# Patient Record
Sex: Female | Born: 1935
Health system: Southern US, Community
[De-identification: ages and names within clinical notes are randomized; demographics above are authoritative.]

## PROBLEM LIST (undated history)

## (undated) DIAGNOSIS — G43909 Migraine, unspecified, not intractable, without status migrainosus: Secondary | ICD-10-CM

## (undated) DIAGNOSIS — N390 Urinary tract infection, site not specified: Secondary | ICD-10-CM

## (undated) DIAGNOSIS — N952 Postmenopausal atrophic vaginitis: Secondary | ICD-10-CM

## (undated) DIAGNOSIS — E039 Hypothyroidism, unspecified: Secondary | ICD-10-CM

## (undated) DIAGNOSIS — J309 Allergic rhinitis, unspecified: Secondary | ICD-10-CM

## (undated) DIAGNOSIS — F329 Major depressive disorder, single episode, unspecified: Secondary | ICD-10-CM

## (undated) DIAGNOSIS — F32A Depression, unspecified: Secondary | ICD-10-CM

## (undated) DIAGNOSIS — K219 Gastro-esophageal reflux disease without esophagitis: Secondary | ICD-10-CM

## (undated) DIAGNOSIS — F419 Anxiety disorder, unspecified: Secondary | ICD-10-CM

## (undated) HISTORY — DX: Gastro-esophageal reflux disease without esophagitis: K21.9

## (undated) HISTORY — PX: ABDOMINAL HYSTERECTOMY: SHX81

## (undated) HISTORY — DX: Hypothyroidism, unspecified: E03.9

## (undated) HISTORY — DX: Depression, unspecified: F32.A

## (undated) HISTORY — DX: Allergic rhinitis, unspecified: J30.9

## (undated) HISTORY — DX: Major depressive disorder, single episode, unspecified: F32.9

## (undated) HISTORY — DX: Postmenopausal atrophic vaginitis: N95.2

## (undated) HISTORY — PX: BACK SURGERY: SHX140

## (undated) HISTORY — DX: Migraine, unspecified, not intractable, without status migrainosus: G43.909

## (undated) HISTORY — DX: Anxiety disorder, unspecified: F41.9

## (undated) HISTORY — DX: Urinary tract infection, site not specified: N39.0

---

## 2015-05-24 DIAGNOSIS — N83201 Unspecified ovarian cyst, right side: Secondary | ICD-10-CM | POA: Diagnosis not present

## 2015-06-08 DIAGNOSIS — K219 Gastro-esophageal reflux disease without esophagitis: Secondary | ICD-10-CM | POA: Diagnosis not present

## 2015-06-08 DIAGNOSIS — F320 Major depressive disorder, single episode, mild: Secondary | ICD-10-CM | POA: Diagnosis not present

## 2015-06-08 DIAGNOSIS — Z6823 Body mass index (BMI) 23.0-23.9, adult: Secondary | ICD-10-CM | POA: Diagnosis not present

## 2015-06-08 DIAGNOSIS — Z1389 Encounter for screening for other disorder: Secondary | ICD-10-CM | POA: Diagnosis not present

## 2015-06-08 DIAGNOSIS — I739 Peripheral vascular disease, unspecified: Secondary | ICD-10-CM | POA: Diagnosis not present

## 2015-06-08 DIAGNOSIS — N83201 Unspecified ovarian cyst, right side: Secondary | ICD-10-CM | POA: Diagnosis not present

## 2015-06-08 DIAGNOSIS — R634 Abnormal weight loss: Secondary | ICD-10-CM | POA: Diagnosis not present

## 2015-06-10 DIAGNOSIS — Z1211 Encounter for screening for malignant neoplasm of colon: Secondary | ICD-10-CM | POA: Diagnosis not present

## 2015-06-17 DIAGNOSIS — M19171 Post-traumatic osteoarthritis, right ankle and foot: Secondary | ICD-10-CM | POA: Diagnosis not present

## 2015-06-17 DIAGNOSIS — M24874 Other specific joint derangements of right foot, not elsewhere classified: Secondary | ICD-10-CM | POA: Diagnosis not present

## 2015-07-04 DIAGNOSIS — M79671 Pain in right foot: Secondary | ICD-10-CM | POA: Diagnosis not present

## 2015-08-05 DIAGNOSIS — R05 Cough: Secondary | ICD-10-CM | POA: Diagnosis not present

## 2015-08-05 DIAGNOSIS — H1045 Other chronic allergic conjunctivitis: Secondary | ICD-10-CM | POA: Diagnosis not present

## 2015-08-05 DIAGNOSIS — J310 Chronic rhinitis: Secondary | ICD-10-CM | POA: Diagnosis not present

## 2015-08-17 DIAGNOSIS — R338 Other retention of urine: Secondary | ICD-10-CM | POA: Diagnosis not present

## 2015-08-17 DIAGNOSIS — R300 Dysuria: Secondary | ICD-10-CM | POA: Diagnosis not present

## 2015-08-17 DIAGNOSIS — N390 Urinary tract infection, site not specified: Secondary | ICD-10-CM | POA: Diagnosis not present

## 2015-08-17 DIAGNOSIS — N3000 Acute cystitis without hematuria: Secondary | ICD-10-CM | POA: Diagnosis not present

## 2015-08-24 DIAGNOSIS — N3000 Acute cystitis without hematuria: Secondary | ICD-10-CM | POA: Diagnosis not present

## 2015-08-24 DIAGNOSIS — N952 Postmenopausal atrophic vaginitis: Secondary | ICD-10-CM | POA: Diagnosis not present

## 2015-08-24 DIAGNOSIS — N390 Urinary tract infection, site not specified: Secondary | ICD-10-CM | POA: Diagnosis not present

## 2015-09-14 DIAGNOSIS — E782 Mixed hyperlipidemia: Secondary | ICD-10-CM | POA: Diagnosis not present

## 2015-09-14 DIAGNOSIS — E538 Deficiency of other specified B group vitamins: Secondary | ICD-10-CM | POA: Diagnosis not present

## 2015-09-14 DIAGNOSIS — R799 Abnormal finding of blood chemistry, unspecified: Secondary | ICD-10-CM | POA: Diagnosis not present

## 2015-09-14 DIAGNOSIS — E039 Hypothyroidism, unspecified: Secondary | ICD-10-CM | POA: Diagnosis not present

## 2015-09-16 DIAGNOSIS — H26493 Other secondary cataract, bilateral: Secondary | ICD-10-CM | POA: Diagnosis not present

## 2015-09-26 DIAGNOSIS — J309 Allergic rhinitis, unspecified: Secondary | ICD-10-CM | POA: Diagnosis not present

## 2015-09-26 DIAGNOSIS — E039 Hypothyroidism, unspecified: Secondary | ICD-10-CM | POA: Diagnosis not present

## 2015-09-26 DIAGNOSIS — N83201 Unspecified ovarian cyst, right side: Secondary | ICD-10-CM | POA: Diagnosis not present

## 2015-09-26 DIAGNOSIS — R5383 Other fatigue: Secondary | ICD-10-CM | POA: Diagnosis not present

## 2015-09-26 DIAGNOSIS — R634 Abnormal weight loss: Secondary | ICD-10-CM | POA: Diagnosis not present

## 2015-09-26 DIAGNOSIS — G4709 Other insomnia: Secondary | ICD-10-CM | POA: Diagnosis not present

## 2015-09-26 DIAGNOSIS — F3289 Other specified depressive episodes: Secondary | ICD-10-CM | POA: Diagnosis not present

## 2015-09-26 DIAGNOSIS — Z6823 Body mass index (BMI) 23.0-23.9, adult: Secondary | ICD-10-CM | POA: Diagnosis not present

## 2015-09-26 DIAGNOSIS — R05 Cough: Secondary | ICD-10-CM | POA: Diagnosis not present

## 2015-09-26 DIAGNOSIS — Z8673 Personal history of transient ischemic attack (TIA), and cerebral infarction without residual deficits: Secondary | ICD-10-CM | POA: Diagnosis not present

## 2015-09-26 DIAGNOSIS — K219 Gastro-esophageal reflux disease without esophagitis: Secondary | ICD-10-CM | POA: Diagnosis not present

## 2015-10-06 DIAGNOSIS — R634 Abnormal weight loss: Secondary | ICD-10-CM | POA: Diagnosis not present

## 2015-10-19 DIAGNOSIS — R1310 Dysphagia, unspecified: Secondary | ICD-10-CM | POA: Diagnosis not present

## 2015-10-19 DIAGNOSIS — K449 Diaphragmatic hernia without obstruction or gangrene: Secondary | ICD-10-CM | POA: Diagnosis not present

## 2015-11-09 DIAGNOSIS — N3020 Other chronic cystitis without hematuria: Secondary | ICD-10-CM | POA: Diagnosis not present

## 2015-11-09 DIAGNOSIS — N390 Urinary tract infection, site not specified: Secondary | ICD-10-CM | POA: Diagnosis not present

## 2015-11-09 DIAGNOSIS — R351 Nocturia: Secondary | ICD-10-CM | POA: Diagnosis not present

## 2015-11-09 DIAGNOSIS — N358 Other urethral stricture: Secondary | ICD-10-CM | POA: Diagnosis not present

## 2015-11-09 DIAGNOSIS — N952 Postmenopausal atrophic vaginitis: Secondary | ICD-10-CM | POA: Diagnosis not present

## 2016-03-29 DIAGNOSIS — R05 Cough: Secondary | ICD-10-CM | POA: Diagnosis not present

## 2016-03-29 DIAGNOSIS — J209 Acute bronchitis, unspecified: Secondary | ICD-10-CM | POA: Diagnosis not present

## 2016-03-29 DIAGNOSIS — Z87891 Personal history of nicotine dependence: Secondary | ICD-10-CM | POA: Diagnosis not present

## 2016-03-31 DIAGNOSIS — R030 Elevated blood-pressure reading, without diagnosis of hypertension: Secondary | ICD-10-CM | POA: Diagnosis not present

## 2016-03-31 DIAGNOSIS — Z87891 Personal history of nicotine dependence: Secondary | ICD-10-CM | POA: Diagnosis not present

## 2016-03-31 DIAGNOSIS — R05 Cough: Secondary | ICD-10-CM | POA: Diagnosis not present

## 2016-03-31 DIAGNOSIS — J069 Acute upper respiratory infection, unspecified: Secondary | ICD-10-CM | POA: Diagnosis not present

## 2016-03-31 DIAGNOSIS — R918 Other nonspecific abnormal finding of lung field: Secondary | ICD-10-CM | POA: Diagnosis not present

## 2016-04-04 DIAGNOSIS — J209 Acute bronchitis, unspecified: Secondary | ICD-10-CM | POA: Diagnosis not present

## 2016-04-04 DIAGNOSIS — J449 Chronic obstructive pulmonary disease, unspecified: Secondary | ICD-10-CM | POA: Diagnosis not present

## 2016-04-04 DIAGNOSIS — J440 Chronic obstructive pulmonary disease with acute lower respiratory infection: Secondary | ICD-10-CM | POA: Diagnosis not present

## 2016-05-29 DIAGNOSIS — G43909 Migraine, unspecified, not intractable, without status migrainosus: Secondary | ICD-10-CM | POA: Diagnosis not present

## 2016-05-29 DIAGNOSIS — I739 Peripheral vascular disease, unspecified: Secondary | ICD-10-CM | POA: Diagnosis not present

## 2016-05-29 DIAGNOSIS — G4709 Other insomnia: Secondary | ICD-10-CM | POA: Diagnosis not present

## 2016-05-29 DIAGNOSIS — Z23 Encounter for immunization: Secondary | ICD-10-CM | POA: Diagnosis not present

## 2016-05-29 DIAGNOSIS — Z6822 Body mass index (BMI) 22.0-22.9, adult: Secondary | ICD-10-CM | POA: Diagnosis not present

## 2016-05-29 DIAGNOSIS — F320 Major depressive disorder, single episode, mild: Secondary | ICD-10-CM | POA: Diagnosis not present

## 2016-07-09 DIAGNOSIS — M545 Low back pain: Secondary | ICD-10-CM | POA: Diagnosis not present

## 2016-07-31 DIAGNOSIS — M545 Low back pain: Secondary | ICD-10-CM | POA: Diagnosis not present

## 2016-08-06 DIAGNOSIS — M160 Bilateral primary osteoarthritis of hip: Secondary | ICD-10-CM | POA: Diagnosis not present

## 2016-08-06 DIAGNOSIS — E039 Hypothyroidism, unspecified: Secondary | ICD-10-CM | POA: Diagnosis not present

## 2016-08-06 DIAGNOSIS — F329 Major depressive disorder, single episode, unspecified: Secondary | ICD-10-CM | POA: Diagnosis not present

## 2016-08-06 DIAGNOSIS — M1612 Unilateral primary osteoarthritis, left hip: Secondary | ICD-10-CM | POA: Diagnosis not present

## 2016-08-06 DIAGNOSIS — M25552 Pain in left hip: Secondary | ICD-10-CM | POA: Diagnosis not present

## 2016-08-10 DIAGNOSIS — M5442 Lumbago with sciatica, left side: Secondary | ICD-10-CM | POA: Diagnosis not present

## 2016-08-10 DIAGNOSIS — Z6822 Body mass index (BMI) 22.0-22.9, adult: Secondary | ICD-10-CM | POA: Diagnosis not present

## 2016-08-10 DIAGNOSIS — M1612 Unilateral primary osteoarthritis, left hip: Secondary | ICD-10-CM | POA: Diagnosis not present

## 2016-08-10 DIAGNOSIS — G4709 Other insomnia: Secondary | ICD-10-CM | POA: Diagnosis not present

## 2016-08-20 DIAGNOSIS — M5432 Sciatica, left side: Secondary | ICD-10-CM | POA: Diagnosis not present

## 2016-08-26 ENCOUNTER — Encounter (HOSPITAL_COMMUNITY): Payer: Self-pay | Admitting: Emergency Medicine

## 2016-08-26 ENCOUNTER — Inpatient Hospital Stay (HOSPITAL_COMMUNITY)
Admission: EM | Admit: 2016-08-26 | Discharge: 2016-08-28 | DRG: 520 | Disposition: A | Discharge status: Home / Self Care | Payer: Medicare Other | Attending: Neurosurgery | Admitting: Neurosurgery

## 2016-08-26 ENCOUNTER — Emergency Department (HOSPITAL_COMMUNITY): Payer: Medicare Other

## 2016-08-26 DIAGNOSIS — M5117 Intervertebral disc disorders with radiculopathy, lumbosacral region: Principal | ICD-10-CM | POA: Diagnosis present

## 2016-08-26 DIAGNOSIS — M5432 Sciatica, left side: Secondary | ICD-10-CM | POA: Diagnosis not present

## 2016-08-26 DIAGNOSIS — M5416 Radiculopathy, lumbar region: Secondary | ICD-10-CM

## 2016-08-26 DIAGNOSIS — M5126 Other intervertebral disc displacement, lumbar region: Secondary | ICD-10-CM | POA: Diagnosis present

## 2016-08-26 DIAGNOSIS — M5127 Other intervertebral disc displacement, lumbosacral region: Secondary | ICD-10-CM | POA: Diagnosis not present

## 2016-08-26 DIAGNOSIS — E039 Hypothyroidism, unspecified: Secondary | ICD-10-CM | POA: Diagnosis present

## 2016-08-26 DIAGNOSIS — K219 Gastro-esophageal reflux disease without esophagitis: Secondary | ICD-10-CM | POA: Diagnosis present

## 2016-08-26 DIAGNOSIS — Z419 Encounter for procedure for purposes other than remedying health state, unspecified: Secondary | ICD-10-CM

## 2016-08-26 DIAGNOSIS — Z8701 Personal history of pneumonia (recurrent): Secondary | ICD-10-CM | POA: Diagnosis not present

## 2016-08-26 DIAGNOSIS — IMO0002 Reserved for concepts with insufficient information to code with codable children: Secondary | ICD-10-CM | POA: Diagnosis present

## 2016-08-26 DIAGNOSIS — Z87891 Personal history of nicotine dependence: Secondary | ICD-10-CM | POA: Diagnosis not present

## 2016-08-26 DIAGNOSIS — R2689 Other abnormalities of gait and mobility: Secondary | ICD-10-CM | POA: Diagnosis not present

## 2016-08-26 DIAGNOSIS — Z79899 Other long term (current) drug therapy: Secondary | ICD-10-CM

## 2016-08-26 DIAGNOSIS — R269 Unspecified abnormalities of gait and mobility: Secondary | ICD-10-CM

## 2016-08-26 DIAGNOSIS — M5116 Intervertebral disc disorders with radiculopathy, lumbar region: Secondary | ICD-10-CM | POA: Diagnosis not present

## 2016-08-26 DIAGNOSIS — R52 Pain, unspecified: Secondary | ICD-10-CM

## 2016-08-26 LAB — CBC WITH DIFFERENTIAL/PLATELET
BASOS ABS: 0 10*3/uL (ref 0.0–0.1)
BASOS PCT: 0 %
Eosinophils Absolute: 0 10*3/uL (ref 0.0–0.7)
Eosinophils Relative: 0 %
HEMATOCRIT: 40.3 % (ref 36.0–46.0)
Hemoglobin: 13.8 g/dL (ref 12.0–15.0)
LYMPHS ABS: 1.2 10*3/uL (ref 0.7–4.0)
Lymphocytes Relative: 12 %
MCH: 30.9 pg (ref 26.0–34.0)
MCHC: 34.2 g/dL (ref 30.0–36.0)
MCV: 90.4 fL (ref 78.0–100.0)
MONOS PCT: 7 %
Monocytes Absolute: 0.7 10*3/uL (ref 0.1–1.0)
NEUTROS ABS: 7.6 10*3/uL (ref 1.7–7.7)
NEUTROS PCT: 81 %
Platelets: 277 10*3/uL (ref 150–400)
RBC: 4.46 MIL/uL (ref 3.87–5.11)
RDW: 13.7 % (ref 11.5–15.5)
WBC: 9.5 10*3/uL (ref 4.0–10.5)

## 2016-08-26 LAB — URINALYSIS, ROUTINE W REFLEX MICROSCOPIC
BILIRUBIN URINE: NEGATIVE
Glucose, UA: NEGATIVE mg/dL
Hgb urine dipstick: NEGATIVE
KETONES UR: NEGATIVE mg/dL
Leukocytes, UA: NEGATIVE
NITRITE: NEGATIVE
PROTEIN: NEGATIVE mg/dL
Specific Gravity, Urine: 1.013 (ref 1.005–1.030)
pH: 6 (ref 5.0–8.0)

## 2016-08-26 LAB — BASIC METABOLIC PANEL
ANION GAP: 8 (ref 5–15)
BUN: 14 mg/dL (ref 6–20)
CHLORIDE: 100 mmol/L — AB (ref 101–111)
CO2: 25 mmol/L (ref 22–32)
Calcium: 9.2 mg/dL (ref 8.9–10.3)
Creatinine, Ser: 0.6 mg/dL (ref 0.44–1.00)
GFR calc non Af Amer: >60 mL/min (ref >60)
Glucose, Bld: 113 mg/dL — ABNORMAL HIGH (ref 65–99)
Potassium: 4.2 mmol/L (ref 3.5–5.1)
Sodium: 133 mmol/L — ABNORMAL LOW (ref 135–145)

## 2016-08-26 LAB — PROTIME-INR
INR: 0.96
Prothrombin Time: 12.8 seconds (ref 11.4–15.2)

## 2016-08-26 MED ORDER — MORPHINE SULFATE (PF) 2 MG/ML IV SOLN: 4.0000 mg | INTRAVENOUS | Status: DC | PRN

## 2016-08-26 MED ORDER — KETOROLAC TROMETHAMINE 30 MG/ML IJ SOLN
15.0000 mg | Freq: Once | INTRAMUSCULAR | Status: AC
  Administered 2016-08-26: 15 mg via INTRAVENOUS
  Filled 2016-08-26: qty 1

## 2016-08-26 MED ORDER — OXYCODONE-ACETAMINOPHEN 5-325 MG PO TABS
2.0000 | ORAL_TABLET | Freq: Once | ORAL | Status: AC
  Administered 2016-08-26: 2 via ORAL
  Filled 2016-08-26: qty 2

## 2016-08-26 MED ORDER — MORPHINE SULFATE (PF) 2 MG/ML IV SOLN
4.0000 mg | Freq: Once | INTRAVENOUS | Status: AC
  Administered 2016-08-26: 4 mg via INTRAVENOUS
  Filled 2016-08-26: qty 2

## 2016-08-26 MED ORDER — ONDANSETRON HCL 4 MG/2ML IJ SOLN: 4.0000 mg | Freq: Four times a day (QID) | INTRAMUSCULAR | Status: DC | PRN

## 2016-08-26 NOTE — ED Notes (Signed)
Pt cannot use restroom at this time, aware urine specimen is needed.  

## 2016-08-26 NOTE — ED Notes (Signed)
Pt transported to MRI 

## 2016-08-26 NOTE — ED Notes (Signed)
[facility] here to transfer pt to [facility]. 

## 2016-08-26 NOTE — ED Provider Notes (Signed)
[facility] DEPT Provider Note   CSN: [id number] Arrival date & time: 08/26/16  1517     History   Chief Complaint Chief Complaint  Patient presents with  . Leg Pain    HPI Jeanette Butler is a 83 y.o. female.  HPI Patient have been developing some sciatica pain for several weeks. She has been treated with prednisone taper for the past 2 weeks. She's been taking tramadol for pain. Pain was marginally controlled with these interventions. She reports this morning however she went to take a shower and she tried to wash her feet and got sudden and intense pain in her left buttock all the way to her foot. Since then the pain has been so severe she's been unable to bear weight. As a sharp intense pain that burns to her foot. No difficulty urinating. Mobility significantly impaired. No associated abdominal pain. No difficulty urinating. History reviewed. No pertinent past medical history.  Patient Active Problem List   Diagnosis Date Noted  . Herniated nucleus pulposus 08/26/2016    Past Surgical History:  Procedure Laterality Date  . ABDOMINAL HYSTERECTOMY    . BACK SURGERY     2006: L4,L5    OB History    No data available       Home Medications    Prior to Admission medications   Medication Sig Start Date End Date Taking? Authorizing Provider  buPROPion (WELLBUTRIN XL) 150 MG 24 hr tablet Take 150 mg by mouth daily. 08/09/16  Yes Historical Provider, MD  butalbital-aspirin-caffeine Acquanetta Chain) 50-325-40 MG capsule Take 1 capsule by mouth every 4 (four) hours as needed for headache. 06/01/16  Yes Historical Provider, MD  estradiol (ESTRACE) 0.1 MG/GM vaginal cream Place 1 Applicatorful vaginally 3 (three) times a week. 08/09/16  Yes Historical Provider, MD  FLUoxetine (PROZAC) 20 MG capsule Take 20 mg by mouth daily. 06/01/16  Yes Historical Provider, MD  ipratropium (ATROVENT) 0.06 % nasal spray Place 2 sprays into both nostrils 4 (four) times daily. 06/01/16  Yes Historical  Provider, MD  levothyroxine (SYNTHROID, LEVOTHROID) 75 MCG tablet Take 75 mcg by mouth daily. 08/09/16  Yes Historical Provider, MD  omeprazole (PRILOSEC) 40 MG capsule Take 40 mg by mouth daily.   Yes Historical Provider, MD  zolpidem (AMBIEN) 10 MG tablet Take 10 mg by mouth at bedtime. 08/09/16  Yes Historical Provider, MD    Family History No family history on file.  Social History Social History  Substance Use Topics  . Smoking status: Former Research scientist (life sciences)  . Smokeless tobacco: Not on file  . Alcohol use 4.2 oz/week    7 Glasses of wine per week     Allergies   Patient has no known allergies.   Review of Systems Review of Systems 10 Systems reviewed and are negative for acute change except as noted in the HPI.   Physical Exam Updated Vital Signs BP 123/76   Pulse 90   Temp 97.9 F (36.6 C)   Resp 18   Ht 5\' 5"  (1.651 m)   Wt 139 lb (63 kg)   SpO2 95%   BMI 23.13 kg/m   Physical Exam  Constitutional: She is oriented to person, place, and time. She appears well-developed and well-nourished. No distress.  Patient has severe pain. She is keeping her left leg slightly flexed. Any movement is exquisitely painful.  HENT:  Head: Normocephalic and atraumatic.  Eyes: Conjunctivae and EOM are normal.  Neck: Neck supple.  Cardiovascular: Normal rate and regular rhythm.  No murmur heard. Pulmonary/Chest: Effort normal and breath sounds normal. No respiratory distress.  Abdominal: Soft. She exhibits no distension. There is no tenderness. There is no guarding.  Musculoskeletal: She exhibits no edema.  Patient has severe left buttock and hip pain with any movement of the lower extremity. Pain is reproducible in the sciatic canal of buttock. No significant peripheral edema. Calf is soft and nontender.  Neurological: She is alert and oriented to person, place, and time. No cranial nerve deficit. Coordination normal.  Strength testing of left lower extremity is pain limited.  Skin:  Skin is warm and dry.  Psychiatric: She has a normal mood and affect.  Nursing note and vitals reviewed.    ED Treatments / Results  Labs (all labs ordered are listed, but only abnormal results are displayed) Labs Reviewed  BASIC METABOLIC PANEL - Abnormal; Notable for the following:       Result Value   Sodium 133 (*)    Chloride 100 (*)    Glucose, Bld 113 (*)    All other components within normal limits  CBC WITH DIFFERENTIAL/PLATELET  PROTIME-INR  URINALYSIS, ROUTINE W REFLEX MICROSCOPIC    EKG  EKG Interpretation None       Radiology Mr Lumbar Spine Wo Contrast  Result Date: 08/26/2016 CLINICAL DATA:  Initial evaluation for left lumbar radiculopathy. EXAM: MRI LUMBAR SPINE WITHOUT CONTRAST TECHNIQUE: Multiplanar, multisequence MR imaging of the lumbar spine was performed. No intravenous contrast was administered. COMPARISON:  None available. FINDINGS: Segmentation: Normal segmentation. Lowest well-formed disc is labeled the L5-S1 level. Alignment: Trace anterolisthesis of L5 on S1. Trace retrolisthesis of L1 on L2 and L2 on L3. Vertebral bodies otherwise normally aligned with preservation of the normal lumbar lordosis. Vertebrae: Vertebral body heights are maintained. No evidence for acute or chronic fracture. Multiple scattered chronic endplate Schmorl's nodes noted throughout the lumbar spine. Benign hemangioma noted within the L4 vertebral body. Signal intensity within the vertebral body bone marrow mildly heterogeneous but within normal limits. No worrisome osseous lesion. Conus medullaris: Extends to the L1 level and appears normal. Paraspinal and other soft tissues: Paraspinous soft tissues within normal limits. T2 hyperintense cyst noted within the right kidney. Visualized vessel structures otherwise unremarkable. Disc levels: L1-2: The mild diffuse disc bulge with disc desiccation and intervertebral disc space narrowing. Disc bulging slightly eccentric to the left without  focal disc protrusion. No significant stenosis or evidence for neural impingement. L2-3: Diffuse degenerative disc bulge with disc desiccation and intervertebral disc space narrowing. Disc bulging eccentric to the left without focal disc protrusion. No significant canal stenosis. Mild to moderate left foraminal stenosis, with the bulging disc closely approximating the exiting left L2 nerve root (series 3, image 11). No significant right foraminal stenosis. L3-4: Diffuse degenerative disc bulge with disc desiccation and intervertebral disc space narrowing. Mild facet and ligamentum flavum hypertrophy. Resultant mild canal and bilateral lateral recess narrowing. No significant foraminal encroachment. L4-5: Diffuse degenerative disc bulge with disc desiccation and intervertebral disc space narrowing. No significant canal or lateral recess stenosis. Mild left foraminal narrowing. L5-S1: Diffuse degenerative disc bulge with disc desiccation and intervertebral disc space narrowing. There is a superimposed left subarticular disc extrusion with inferior migration (series 6, image 34). Extruded disc material encroaches upon the left lateral recess, likely impinging upon the transiting left S1 nerve root. Superimposed mild facet arthrosis. Mild bilateral foraminal stenosis. IMPRESSION: 1. Left subarticular disc extrusion with inferior migration at L5-S1, impinging upon the descending left S1 nerve root  in the left lateral recess. 2. Left E centric disc bulge at L2-3, closely approximating and potentially irritating the exiting left L2 nerve root. 3. Additional mild for age multilevel degenerative spondylolysis as above. No other significant stenosis or evidence for neural impingement. Electronically Signed   By: Jeannine Boga M.D.   On: 08/26/2016 20:08    Procedures Procedures (including critical care time)  Medications Ordered in ED Medications  morphine 2 MG/ML injection 4 mg (not administered)  ondansetron  (ZOFRAN) injection 4 mg (not administered)  morphine 2 MG/ML injection 4 mg (4 mg Intravenous Given 08/26/16 1715)  oxyCODONE-acetaminophen (PERCOCET/ROXICET) 5-325 MG per tablet 2 tablet (2 tablets Oral Given 08/26/16 2024)  ketorolac (TORADOL) 30 MG/ML injection 15 mg (15 mg Intravenous Given 08/26/16 2025)     Initial Impression / Assessment and Plan / ED Course  I have reviewed the triage vital signs and the nursing notes.  Pertinent labs & imaging results that were available during my care of the patient were reviewed by me and considered in my medical decision making (see chart for details).     Consult: Dr. Sherwood Gambler for admission.   Final Clinical Impressions(s) / ED Diagnoses   Final diagnoses:  Left lumbar radiculopathy  Sciatica of left side  Neurologic gait dysfunction  Herniated nucleus pulposus, L5-S1, left  Intractable pain   Patient has had 2 weeks versus steroid therapy and worsening pain. With certain movement pain has become intractable today. Attempt was made at pain control with morphine which was helpful. Patient was also given Toradol 15 mg and 2 Percocet by mouth. Attempt at gait testing made but patient was unable to bear weight due to severity of radiating pain. Patient is admitted to neurosurgery for disc herniation with severe radiculopathy pain. New Prescriptions New Prescriptions   No medications on file     Charlesetta Shanks, MD 08/26/16 2309

## 2016-08-26 NOTE — ED Triage Notes (Signed)
Pt c/o pain to left proximal posterior thigh radiating down posterior leg to toes x 2 weeks. PCP diagnosed pain as sciatica. Taking prednisone and Tramadol without relief.

## 2016-08-26 NOTE — ED Notes (Signed)
MRI called and states that they are calling in someone to take pt to get scan.

## 2016-08-27 ENCOUNTER — Inpatient Hospital Stay (HOSPITAL_COMMUNITY): Payer: Medicare Other

## 2016-08-27 ENCOUNTER — Encounter (HOSPITAL_COMMUNITY): Payer: Self-pay | Admitting: *Deleted

## 2016-08-27 ENCOUNTER — Inpatient Hospital Stay (HOSPITAL_COMMUNITY): Payer: Medicare Other | Admitting: Anesthesiology

## 2016-08-27 ENCOUNTER — Encounter (HOSPITAL_COMMUNITY)
Admission: EM | Disposition: A | Discharge status: Home / Self Care | Payer: Self-pay | Source: Home / Self Care | Attending: Neurosurgery

## 2016-08-27 DIAGNOSIS — M5117 Intervertebral disc disorders with radiculopathy, lumbosacral region: Secondary | ICD-10-CM | POA: Diagnosis not present

## 2016-08-27 DIAGNOSIS — M5126 Other intervertebral disc displacement, lumbar region: Secondary | ICD-10-CM | POA: Diagnosis present

## 2016-08-27 HISTORY — PX: LUMBAR LAMINECTOMY/DECOMPRESSION MICRODISCECTOMY: SHX5026

## 2016-08-27 LAB — MRSA PCR SCREENING: MRSA by PCR: NEGATIVE

## 2016-08-27 SURGERY — LUMBAR LAMINECTOMY/DECOMPRESSION MICRODISCECTOMY 1 LEVEL
Anesthesia: General | Laterality: Left

## 2016-08-27 MED ORDER — SUGAMMADEX SODIUM 200 MG/2ML IV SOLN
INTRAVENOUS | Status: AC
  Filled 2016-08-27: qty 2

## 2016-08-27 MED ORDER — SODIUM CHLORIDE 0.9 % IR SOLN
Status: DC | PRN
  Administered 2016-08-27: 14:00:00

## 2016-08-27 MED ORDER — FLEET ENEMA 7-19 GM/118ML RE ENEM: 1.0000 | ENEMA | Freq: Once | RECTAL | Status: DC | PRN

## 2016-08-27 MED ORDER — THROMBIN 5000 UNITS EX SOLR
CUTANEOUS | Status: AC
  Filled 2016-08-27: qty 10000

## 2016-08-27 MED ORDER — HYDROXYZINE HCL 50 MG PO TABS
50.0000 mg | ORAL_TABLET | ORAL | Status: DC | PRN
  Filled 2016-08-27: qty 1

## 2016-08-27 MED ORDER — MORPHINE SULFATE (PF) 4 MG/ML IV SOLN
4.0000 mg | INTRAVENOUS | Status: DC | PRN
  Administered 2016-08-27: 4 mg via INTRAMUSCULAR
  Filled 2016-08-27: qty 1

## 2016-08-27 MED ORDER — ONDANSETRON HCL 4 MG PO TABS: 4.0000 mg | ORAL_TABLET | Freq: Four times a day (QID) | ORAL | Status: DC | PRN

## 2016-08-27 MED ORDER — FENTANYL CITRATE (PF) 100 MCG/2ML IJ SOLN
INTRAMUSCULAR | Status: DC | PRN
  Administered 2016-08-27 (×3): 50 ug via INTRAVENOUS

## 2016-08-27 MED ORDER — LIDOCAINE 2% (20 MG/ML) 5 ML SYRINGE
INTRAMUSCULAR | Status: AC
  Filled 2016-08-27: qty 5

## 2016-08-27 MED ORDER — PROPOFOL 10 MG/ML IV BOLUS
INTRAVENOUS | Status: DC | PRN
  Administered 2016-08-27: 20 mg via INTRAVENOUS
  Administered 2016-08-27: 110 mg via INTRAVENOUS

## 2016-08-27 MED ORDER — CEFAZOLIN SODIUM-DEXTROSE 2-3 GM-% IV SOLR
INTRAVENOUS | Status: DC | PRN
  Administered 2016-08-27: 2 g via INTRAVENOUS

## 2016-08-27 MED ORDER — ACETAMINOPHEN 10 MG/ML IV SOLN
INTRAVENOUS | Status: AC
  Filled 2016-08-27: qty 100

## 2016-08-27 MED ORDER — ZOLPIDEM TARTRATE 5 MG PO TABS: 10.0000 mg | ORAL_TABLET | Freq: Every day | ORAL | Status: DC

## 2016-08-27 MED ORDER — ACETAMINOPHEN 325 MG PO TABS: 650.0000 mg | ORAL_TABLET | Freq: Four times a day (QID) | ORAL | Status: DC | PRN

## 2016-08-27 MED ORDER — ACETAMINOPHEN 10 MG/ML IV SOLN
INTRAVENOUS | Status: DC | PRN
  Administered 2016-08-27: 1000 mg via INTRAVENOUS

## 2016-08-27 MED ORDER — ARTIFICIAL TEARS OP OINT
TOPICAL_OINTMENT | OPHTHALMIC | Status: DC | PRN
  Administered 2016-08-27: 1 via OPHTHALMIC

## 2016-08-27 MED ORDER — SUGAMMADEX SODIUM 200 MG/2ML IV SOLN
INTRAVENOUS | Status: DC | PRN
  Administered 2016-08-27: 200 mg via INTRAVENOUS

## 2016-08-27 MED ORDER — ZOLPIDEM TARTRATE 5 MG PO TABS
5.0000 mg | ORAL_TABLET | Freq: Every evening | ORAL | Status: DC | PRN
  Administered 2016-08-27: 5 mg via ORAL
  Filled 2016-08-27: qty 1

## 2016-08-27 MED ORDER — FENTANYL CITRATE (PF) 100 MCG/2ML IJ SOLN
INTRAMUSCULAR | Status: AC
  Filled 2016-08-27: qty 2

## 2016-08-27 MED ORDER — KETOROLAC TROMETHAMINE 15 MG/ML IJ SOLN
15.0000 mg | Freq: Four times a day (QID) | INTRAMUSCULAR | Status: DC
  Administered 2016-08-27 – 2016-08-28 (×2): 15 mg via INTRAVENOUS
  Filled 2016-08-27 (×2): qty 1

## 2016-08-27 MED ORDER — KETOROLAC TROMETHAMINE 15 MG/ML IJ SOLN
INTRAMUSCULAR | Status: AC
  Administered 2016-08-27: 15 mg via INTRAVENOUS
  Filled 2016-08-27: qty 1

## 2016-08-27 MED ORDER — MAGNESIUM HYDROXIDE 400 MG/5ML PO SUSP: 30.0000 mL | Freq: Every day | ORAL | Status: DC | PRN

## 2016-08-27 MED ORDER — MORPHINE SULFATE (PF) 4 MG/ML IV SOLN: 4.0000 mg | INTRAVENOUS | Status: DC | PRN

## 2016-08-27 MED ORDER — CEFAZOLIN SODIUM-DEXTROSE 2-4 GM/100ML-% IV SOLN
INTRAVENOUS | Status: AC
Start: 2016-08-27 — End: 2016-08-28
  Filled 2016-08-27: qty 100

## 2016-08-27 MED ORDER — BISACODYL 10 MG RE SUPP: 10.0000 mg | Freq: Every day | RECTAL | Status: DC | PRN

## 2016-08-27 MED ORDER — 0.9 % SODIUM CHLORIDE (POUR BTL) OPTIME
TOPICAL | Status: DC | PRN
  Administered 2016-08-27: 1000 mL

## 2016-08-27 MED ORDER — ARTIFICIAL TEARS OP OINT
TOPICAL_OINTMENT | OPHTHALMIC | Status: AC
  Filled 2016-08-27: qty 3.5

## 2016-08-27 MED ORDER — ONDANSETRON HCL 4 MG/2ML IJ SOLN
INTRAMUSCULAR | Status: DC | PRN
  Administered 2016-08-27: 4 mg via INTRAVENOUS

## 2016-08-27 MED ORDER — LIDOCAINE-EPINEPHRINE (PF) 2 %-1:200000 IJ SOLN
INTRAMUSCULAR | Status: AC
  Filled 2016-08-27: qty 20

## 2016-08-27 MED ORDER — ZOLPIDEM TARTRATE 5 MG PO TABS
10.0000 mg | ORAL_TABLET | Freq: Every evening | ORAL | Status: DC | PRN
  Administered 2016-08-27: 10 mg via ORAL
  Filled 2016-08-27: qty 2

## 2016-08-27 MED ORDER — PROPOFOL 500 MG/50ML IV EMUL
INTRAVENOUS | Status: DC | PRN
  Administered 2016-08-27: 25 ug/kg/min via INTRAVENOUS

## 2016-08-27 MED ORDER — THROMBIN 5000 UNITS EX SOLR
CUTANEOUS | Status: DC | PRN
  Administered 2016-08-27 (×2): 5000 [IU] via TOPICAL

## 2016-08-27 MED ORDER — FENTANYL CITRATE (PF) 250 MCG/5ML IJ SOLN
INTRAMUSCULAR | Status: AC
  Filled 2016-08-27: qty 5

## 2016-08-27 MED ORDER — POTASSIUM CHLORIDE IN NACL 20-0.9 MEQ/L-% IV SOLN
INTRAVENOUS | Status: DC
  Administered 2016-08-27: 09:00:00 via INTRAVENOUS
  Filled 2016-08-27: qty 1000

## 2016-08-27 MED ORDER — PANTOPRAZOLE SODIUM 40 MG PO TBEC
80.0000 mg | DELAYED_RELEASE_TABLET | Freq: Every day | ORAL | Status: DC
  Administered 2016-08-28: 80 mg via ORAL
  Filled 2016-08-27: qty 2

## 2016-08-27 MED ORDER — BUPIVACAINE HCL (PF) 0.5 % IJ SOLN
INTRAMUSCULAR | Status: DC | PRN
  Administered 2016-08-27: 20 mL

## 2016-08-27 MED ORDER — KETOROLAC TROMETHAMINE 15 MG/ML IJ SOLN
15.0000 mg | Freq: Once | INTRAMUSCULAR | Status: AC
  Administered 2016-08-27: 15 mg via INTRAVENOUS

## 2016-08-27 MED ORDER — EPHEDRINE SULFATE 50 MG/ML IJ SOLN
INTRAMUSCULAR | Status: DC | PRN
  Administered 2016-08-27: 10 mg via INTRAVENOUS

## 2016-08-27 MED ORDER — BUPIVACAINE HCL (PF) 0.5 % IJ SOLN
INTRAMUSCULAR | Status: AC
Start: 2016-08-27 — End: 2016-08-27
  Filled 2016-08-27: qty 30

## 2016-08-27 MED ORDER — ACETAMINOPHEN 650 MG RE SUPP: 650.0000 mg | Freq: Four times a day (QID) | RECTAL | Status: DC | PRN

## 2016-08-27 MED ORDER — ROCURONIUM BROMIDE 100 MG/10ML IV SOLN
INTRAVENOUS | Status: DC | PRN
  Administered 2016-08-27: 35 mg via INTRAVENOUS
  Administered 2016-08-27: 10 mg via INTRAVENOUS

## 2016-08-27 MED ORDER — BUPROPION HCL ER (XL) 150 MG PO TB24
150.0000 mg | ORAL_TABLET | Freq: Every day | ORAL | Status: DC
  Administered 2016-08-28: 150 mg via ORAL
  Filled 2016-08-27: qty 1

## 2016-08-27 MED ORDER — LIDOCAINE-EPINEPHRINE (PF) 2 %-1:200000 IJ SOLN
INTRAMUSCULAR | Status: DC | PRN
  Administered 2016-08-27: 20 mL via INTRADERMAL

## 2016-08-27 MED ORDER — ROCURONIUM BROMIDE 50 MG/5ML IV SOSY
PREFILLED_SYRINGE | INTRAVENOUS | Status: AC
  Filled 2016-08-27: qty 5

## 2016-08-27 MED ORDER — METHYLPREDNISOLONE ACETATE 80 MG/ML IJ SUSP
INTRAMUSCULAR | Status: AC
  Filled 2016-08-27: qty 1

## 2016-08-27 MED ORDER — DEXAMETHASONE SODIUM PHOSPHATE 10 MG/ML IJ SOLN
INTRAMUSCULAR | Status: AC
  Filled 2016-08-27: qty 1

## 2016-08-27 MED ORDER — DEXAMETHASONE SODIUM PHOSPHATE 10 MG/ML IJ SOLN
INTRAMUSCULAR | Status: DC | PRN
  Administered 2016-08-27: 5 mg via INTRAVENOUS

## 2016-08-27 MED ORDER — LIDOCAINE HCL (CARDIAC) 20 MG/ML IV SOLN
INTRAVENOUS | Status: DC | PRN
  Administered 2016-08-27: 40 mg via INTRAVENOUS

## 2016-08-27 MED ORDER — FLUOXETINE HCL 20 MG PO CAPS
20.0000 mg | ORAL_CAPSULE | Freq: Every day | ORAL | Status: DC
  Administered 2016-08-28: 20 mg via ORAL
  Filled 2016-08-27: qty 1

## 2016-08-27 MED ORDER — ZOLPIDEM TARTRATE 5 MG PO TABS: 5.0000 mg | ORAL_TABLET | Freq: Every day | ORAL | Status: DC

## 2016-08-27 MED ORDER — ONDANSETRON HCL 4 MG/2ML IJ SOLN
INTRAMUSCULAR | Status: AC
  Filled 2016-08-27: qty 2

## 2016-08-27 MED ORDER — METHYLPREDNISOLONE ACETATE 80 MG/ML IJ SUSP
INTRAMUSCULAR | Status: DC | PRN
  Administered 2016-08-27: 80 mg

## 2016-08-27 MED ORDER — ONDANSETRON HCL 4 MG/2ML IJ SOLN
4.0000 mg | Freq: Four times a day (QID) | INTRAMUSCULAR | Status: DC | PRN
  Administered 2016-08-27: 4 mg via INTRAVENOUS
  Filled 2016-08-27: qty 2

## 2016-08-27 MED ORDER — HYDROXYZINE HCL 50 MG/ML IM SOLN: 50.0000 mg | INTRAMUSCULAR | Status: DC | PRN

## 2016-08-27 MED ORDER — LEVOTHYROXINE SODIUM 75 MCG PO TABS
75.0000 ug | ORAL_TABLET | Freq: Every day | ORAL | Status: DC
  Administered 2016-08-28 (×2): 75 ug via ORAL
  Filled 2016-08-27: qty 1

## 2016-08-27 MED ORDER — HYDROCODONE-ACETAMINOPHEN 5-325 MG PO TABS: 1.0000 | ORAL_TABLET | ORAL | Status: DC | PRN

## 2016-08-27 MED ORDER — PHENYLEPHRINE HCL 10 MG/ML IJ SOLN
INTRAVENOUS | Status: DC | PRN
  Administered 2016-08-27: 50 ug/min via INTRAVENOUS

## 2016-08-27 MED ORDER — LACTATED RINGERS IV SOLN
INTRAVENOUS | Status: DC
  Administered 2016-08-27 (×3): via INTRAVENOUS

## 2016-08-27 MED ORDER — HEMOSTATIC AGENTS (NO CHARGE) OPTIME
TOPICAL | Status: DC | PRN
Start: 2016-08-27 — End: 2016-08-27
  Administered 2016-08-27: 1 via TOPICAL

## 2016-08-27 MED ORDER — HYDROXYZINE HCL 25 MG PO TABS: 50.0000 mg | ORAL_TABLET | ORAL | Status: DC | PRN

## 2016-08-27 MED ORDER — EPHEDRINE 5 MG/ML INJ
INTRAVENOUS | Status: AC
  Filled 2016-08-27: qty 10

## 2016-08-27 MED ORDER — PROPOFOL 10 MG/ML IV BOLUS
INTRAVENOUS | Status: AC
  Filled 2016-08-27: qty 20

## 2016-08-27 SURGICAL SUPPLY — 53 items
BAG DECANTER FOR FLEXI CONT (MISCELLANEOUS) ×2 IMPLANT
BENZOIN TINCTURE PRP APPL 2/3 (GAUZE/BANDAGES/DRESSINGS) IMPLANT
BLADE CLIPPER SURG (BLADE) IMPLANT
BUR ACORN 6.0 ACORN (BURR) IMPLANT
BUR ACRON 5.0MM COATED (BURR) ×2 IMPLANT
BUR MATCHSTICK NEURO 3.0 LAGG (BURR) ×2 IMPLANT
CANISTER SUCT 3000ML PPV (MISCELLANEOUS) ×2 IMPLANT
CARTRIDGE OIL MAESTRO DRILL (MISCELLANEOUS) ×1 IMPLANT
DERMABOND ADVANCED (GAUZE/BANDAGES/DRESSINGS) ×1
DERMABOND ADVANCED .7 DNX12 (GAUZE/BANDAGES/DRESSINGS) ×1 IMPLANT
DIFFUSER DRILL AIR PNEUMATIC (MISCELLANEOUS) ×2 IMPLANT
DRAPE LAPAROTOMY 100X72X124 (DRAPES) ×2 IMPLANT
DRAPE MICROSCOPE LEICA (MISCELLANEOUS) ×2 IMPLANT
DRAPE POUCH INSTRU U-SHP 10X18 (DRAPES) ×2 IMPLANT
ELECT REM PT RETURN 9FT ADLT (ELECTROSURGICAL) ×2
ELECTRODE REM PT RTRN 9FT ADLT (ELECTROSURGICAL) ×1 IMPLANT
GAUZE SPONGE 4X4 12PLY STRL (GAUZE/BANDAGES/DRESSINGS) ×2 IMPLANT
GAUZE SPONGE 4X4 16PLY XRAY LF (GAUZE/BANDAGES/DRESSINGS) IMPLANT
GLOVE BIOGEL PI IND STRL 8 (GLOVE) ×1 IMPLANT
GLOVE BIOGEL PI INDICATOR 8 (GLOVE) ×1
GLOVE ECLIPSE 6.5 STRL STRAW (GLOVE) ×2 IMPLANT
GLOVE ECLIPSE 7.5 STRL STRAW (GLOVE) ×10 IMPLANT
GLOVE EXAM NITRILE LRG STRL (GLOVE) IMPLANT
GLOVE EXAM NITRILE XL STR (GLOVE) IMPLANT
GLOVE EXAM NITRILE XS STR PU (GLOVE) IMPLANT
GOWN STRL REUS W/ TWL LRG LVL3 (GOWN DISPOSABLE) ×1 IMPLANT
GOWN STRL REUS W/ TWL XL LVL3 (GOWN DISPOSABLE) ×1 IMPLANT
GOWN STRL REUS W/TWL 2XL LVL3 (GOWN DISPOSABLE) ×2 IMPLANT
GOWN STRL REUS W/TWL LRG LVL3 (GOWN DISPOSABLE) ×1
GOWN STRL REUS W/TWL XL LVL3 (GOWN DISPOSABLE) ×1
KIT BASIN OR (CUSTOM PROCEDURE TRAY) ×2 IMPLANT
KIT ROOM TURNOVER OR (KITS) ×2 IMPLANT
NEEDLE HYPO 18GX1.5 BLUNT FILL (NEEDLE) ×2 IMPLANT
NEEDLE SPNL 18GX3.5 QUINCKE PK (NEEDLE) ×2 IMPLANT
NEEDLE SPNL 22GX3.5 QUINCKE BK (NEEDLE) ×2 IMPLANT
NS IRRIG 1000ML POUR BTL (IV SOLUTION) ×2 IMPLANT
OIL CARTRIDGE MAESTRO DRILL (MISCELLANEOUS) ×2
PACK LAMINECTOMY NEURO (CUSTOM PROCEDURE TRAY) ×2 IMPLANT
PAD ARMBOARD 7.5X6 YLW CONV (MISCELLANEOUS) ×6 IMPLANT
PATTIES SURGICAL .5 X1 (DISPOSABLE) ×2 IMPLANT
RUBBERBAND STERILE (MISCELLANEOUS) ×4 IMPLANT
SPONGE LAP 4X18 X RAY DECT (DISPOSABLE) IMPLANT
SPONGE SURGIFOAM ABS GEL SZ50 (HEMOSTASIS) ×2 IMPLANT
STRIP CLOSURE SKIN 1/2X4 (GAUZE/BANDAGES/DRESSINGS) IMPLANT
SUT PROLENE 6 0 BV (SUTURE) IMPLANT
SUT VIC AB 1 CT1 18XBRD ANBCTR (SUTURE) ×1 IMPLANT
SUT VIC AB 1 CT1 8-18 (SUTURE) ×1
SUT VIC AB 2-0 CP2 18 (SUTURE) ×2 IMPLANT
SUT VIC AB 3-0 SH 8-18 (SUTURE) IMPLANT
SYR 5ML LL (SYRINGE) ×2 IMPLANT
TOWEL GREEN STERILE (TOWEL DISPOSABLE) ×2 IMPLANT
TOWEL GREEN STERILE FF (TOWEL DISPOSABLE) ×2 IMPLANT
WATER STERILE IRR 1000ML POUR (IV SOLUTION) ×2 IMPLANT

## 2016-08-27 NOTE — Progress Notes (Signed)
Vitals:   08/27/16 1551 08/27/16 1600 08/27/16 1607 08/27/16 1626  BP: 137/77  136/77 136/85  Pulse: 88 88 85 94  Resp: 17 17 14 18   Temp:    97.9 F (36.6 C)  TempSrc:      SpO2: 94% 98% 95% 96%  Weight:      Height:        CBC  Recent Labs  08/26/16 1713  WBC 9.5  HGB 13.8  HCT 40.3  PLT 277   BMET  Recent Labs  08/26/16 1713  NA 133*  K 4.2  CL 100*  CO2 25  GLUCOSE 113*  BUN 14  CREATININE 0.60  CALCIUM 9.2    Patient resting in bed. Has ambulated in the halls. Excellent relief of disabling left lumbar radicular pain. Dressing clean and dry. Voiding. No nausea or vomiting.  Plan: Doing well following surgery. Encouraged to ambulate.  Hosie Spangle, MD 08/27/2016, 7:31 PM

## 2016-08-27 NOTE — Progress Notes (Signed)
Pt. Sent to OR. Zofran given per pt. Request as pt. Has post-op N/V. CHG done, MRSA swab sent. Consent completed. No family at the bedside. Valuables in pt. Room.

## 2016-08-27 NOTE — Anesthesia Procedure Notes (Signed)
Procedure Name: Intubation Date/Time: 08/27/2016 1:36 PM Performed by: Suzy Bouchard Pre-anesthesia Checklist: Emergency Drugs available, Suction available, Patient being monitored, Timeout performed and Patient identified Patient Re-evaluated:Patient Re-evaluated prior to inductionOxygen Delivery Method: Circle system utilized Preoxygenation: Pre-oxygenation with 100% oxygen Intubation Type: IV induction Ventilation: Mask ventilation without difficulty Laryngoscope Size: Miller and 2 Grade View: Grade II Tube type: Oral Tube size: 7.0 mm Number of attempts: 1 Airway Equipment and Method: Stylet Placement Confirmation: ETT inserted through vocal cords under direct vision,  positive ETCO2 and breath sounds checked- equal and bilateral Secured at: 21 cm Tube secured with: Tape Dental Injury: Teeth and Oropharynx as per pre-operative assessment

## 2016-08-27 NOTE — Transfer of Care (Signed)
Immediate Anesthesia Transfer of Care Note  Patient: Carlisle Cater  Procedure(s) Performed: Procedure(s): LUMBAR LAMINECTOMY/DECOMPRESSION MICRODISCECTOMY LUMBAR FIVE-SACRAL ONE LEFT (Left)  Patient Location: PACU  Anesthesia Type:General  Level of Consciousness: awake, alert  and oriented  Airway & Oxygen Therapy: Patient Spontanous Breathing and Patient connected to nasal cannula oxygen  Post-op Assessment: Report given to RN, Post -op Vital signs reviewed and stable and Patient moving all extremities X 4  Post vital signs: Reviewed and stable  Last Vitals:  Vitals:   08/27/16 0544 08/27/16 1007  BP: (!) 159/87 (!) 155/86  Pulse: 82 85  Resp: 18 17  Temp: 36.4 C 36.8 C    Last Pain:  Vitals:   08/27/16 1007  TempSrc: Oral  PainSc:       Patients Stated Pain Goal: 3 (69/67/89 3810)  Complications: No apparent anesthesia complications

## 2016-08-27 NOTE — OR Nursing (Signed)
Fentanyl 100 mcg/2 ml given with depo-medrol 80 mg by Dr Sherwood Gambler at surgical site

## 2016-08-27 NOTE — Anesthesia Preprocedure Evaluation (Addendum)
Anesthesia Evaluation  Patient identified by MRN, date of birth, ID band Patient awake    Reviewed: Allergy & Precautions, NPO status , Patient's Chart, lab work & pertinent test results  History of Anesthesia Complications (+) PONV  Airway Mallampati: II  TM Distance: >3 FB Neck ROM: Full    Dental  (+) Teeth Intact, Dental Advisory Given, Caps   Pulmonary former smoker,    breath sounds clear to auscultation       Cardiovascular  Rhythm:Regular Rate:Normal     Neuro/Psych    GI/Hepatic   Endo/Other  Hypothyroidism   Renal/GU      Musculoskeletal   Abdominal   Peds  Hematology   Anesthesia Other Findings   Reproductive/Obstetrics                           Anesthesia Physical Anesthesia Plan  ASA: II  Anesthesia Plan: General   Post-op Pain Management:    Induction: Intravenous  Airway Management Planned: Oral ETT  Additional Equipment:   Intra-op Plan:   Post-operative Plan: Extubation in OR  Informed Consent: I have reviewed the patients History and Physical, chart, labs and discussed the procedure including the risks, benefits and alternatives for the proposed anesthesia with the patient or authorized representative who has indicated his/her understanding and acceptance.   Dental advisory given  Plan Discussed with: CRNA and Anesthesiologist  Anesthesia Plan Comments:         Anesthesia Quick Evaluation

## 2016-08-27 NOTE — Anesthesia Postprocedure Evaluation (Addendum)
Anesthesia Post Note  Patient: Jeanette Butler  Procedure(s) Performed: Procedure(s) (LRB): LUMBAR LAMINECTOMY/DECOMPRESSION MICRODISCECTOMY LUMBAR FIVE-SACRAL ONE LEFT (Left)  Patient location during evaluation: PACU Anesthesia Type: General Level of consciousness: awake, awake and alert and oriented Pain management: pain level controlled Vital Signs Assessment: post-procedure vital signs reviewed and stable Respiratory status: spontaneous breathing, nonlabored ventilation and respiratory function stable Cardiovascular status: blood pressure returned to baseline Anesthetic complications: no       Last Vitals:  Vitals:   08/27/16 1607 08/27/16 1626  BP: 136/77 136/85  Pulse: 85 94  Resp: 14 18  Temp:  36.6 C    Last Pain:  Vitals:   08/27/16 1522  TempSrc:   PainSc: 0-No pain                 Nirvaan Frett COKER

## 2016-08-27 NOTE — Op Note (Signed)
08/26/2016 - 08/27/2016  3:12 PM  PATIENT:  Jeanette Butler  83 y.o. female  PRE-OPERATIVE DIAGNOSIS:  Left L5-S1 lumbar disc nation, lumbar degenerative disease, lumbar spondylosis, left lumbar radiculopathy  POST-OPERATIVE DIAGNOSIS:  Left L5-S1 lumbar disc nation, lumbar degenerative disease, lumbar spondylosis, left lumbar radiculopathy  PROCEDURE:  Procedure(s):  Left L5-S1 lumbar laminotomy and microdiscectomy, with microdissection, microsurgical technique, and the operating microscope  SURGEON:  Surgeon(s): Jovita Gamma, MD Ashok Pall, MD  ASSISTANTS: Ashok Pall, M.D.  ANESTHESIA:   general  EBL:  Total I/O In: 1000 [I.V.:1000] Out: -   COUNT: Correct per nursing staff  DICTATION: Patient was brought to the operating room and placed under general endotracheal anesthesia. Patient was turned to prone position the lumbar region was prepped with Betadine soap and solution and draped in a sterile fashion. The midline was infiltrated with local anesthetic with epinephrine. A localizing x-ray was taken and the L5-S1 level was identified. Midline incision was made over the L5-S1 level and was carried down through the subcutaneous tissue to the lumbar fascia. The lumbar fascia was incised on the left side and the paraspinal muscles were dissected from the spinous processes and lamina in a subperiosteal fashion. Another x-ray was taken and the L5-S1 intralaminar space was identified. The operating microscope was draped and brought into the field provided additional magnification, illumination, and visualization. Laminotomy was performed using the high-speed drill and Kerrison punches. The ligamentum flavum was carefully resected. We found marked thickening of the ligament of flavum, possibly with an embedded synovial cyst, that was carefully mobilized and removed. The underlying thecal sac and nerve root were identified. The disc herniation was identified and the thecal sac and nerve root gently  retracted medially. The disc herniation was a free fragment disc patient with several large fragments compressing the thecal sac and exiting left S1 nerve root. These were removed in a piecemeal fashion, with good decompression of the thecal sac and nerve root. Both Dr. Christella Noa and I felt that although the L5-S1 disc was clearly degenerated, good decompression had been achieved, and that there was no benefit to be gained by proceeding with incision of the annulus and intradiscal discectomy. Therefore once the epidural space been thoroughly explored, and all loose fragments of disc material removed, and good hemostasis established we then proceeded with closure. Hemostasis was established with the use of bipolar cautery and Gelfoam with thrombin. The Gelfoam was removed and hemostasis confirmed. We then instilled 2 cc of fentanyl and 80 mg of Depo-Medrol into the epidural space. Deep fascia was closed with interrupted undyed 1 Vicryl sutures. Scarpa's fascia was closed with interrupted undyed 1 Vicryl sutures in the subcutaneous and subcuticular layer were closed with interrupted inverted 2-0 and 3-0 undyed Vicryl sutures. The skin edges were approximated with Dermabond. A dressing of sterile gauze and Hypafix was applied.  Following surgery the patient was turned back to a supine position to be reversed from the anesthetic extubated and transferred to the recovery room for further care.   PLAN OF CARE: Patient to return to inpatient MedSurg unit  PATIENT DISPOSITION:  PACU - hemodynamically stable.   Delay start of Pharmacological VTE agent (>24hrs) due to surgical blood loss or risk of bleeding:  yes

## 2016-08-27 NOTE — H&P (Signed)
Subjective: Patient is a 83 y.o. right-handed white female who is admitted for treatment of left L5-S1 lumbar disc herniation with acute left lumbar radiculopathy.  Patient is visiting her daughter and her family here in [place], this had a severe exacerbation of a left lumbar radiculopathy. She's been having difficulties with pain at the left buttock for over a year. She feels she may have aggravated it playing bocci at her home in [place]. She saw her primary physician, Dr. Arlester Marker, in [place] 2 weeks ago who diagnosed sciatica and prescribed prednisone. She's also used Advil and Tylenol. She saw an orthopedist Dr. Hamilton Capri in [facility] who also confirmed sciatica. About a week ago she began to develop pain extending to the left posterior thigh and leg, and yesterday while in the shower she developed acute severe disabling pain to the left buttock, posterior thigh, and leg, that was incapacitating. She does not describe any specific weakness in the lower extremities, but explains that she has difficulty bearing weight with the left lower extremities due to pain. She describes numbness and tingling to the left thigh and leg.  She was evaluated at the [facility] emergency room with MRI which revealed a left L5-S1 lumbar disc patient in neurosurgical consultation was requested. Patient was transferred to [facility] for further evaluation, treatment, and care. Patient has been given morphine for pain relief, but is still having discomfort.  Past medical history notable for gastroesophageal reflux disease, hypothyroidism, history of pneumonia on 4 occasions, last 30 years ago. She denies any history of hypertension, myocardial infarction, cancer, stroke, peptic ulcer disease, or diabetes. Previous surgeries include an L4-5 discectomy in 2006 in [place]. Hysterectomy 1970, breast biopsies bilaterally, right foot surgery, and right total knee replacement. She denies hours  to medications. Current medications include Synthroid, Prozac, Ambien, and Prilosec.  Social history: Patient lives by herself in [place]. She is widowed. She doesn't smoke, she has an occasional alcohol beverage, she denies history of substance abuse. She has 3 children, her daughter Maudie Mercury begin lives here in [place] with her husband, and her granddaughter also lives here in [place].   Patient Active Problem List   Diagnosis Date Noted  . Herniated nucleus pulposus 08/26/2016   History reviewed. No pertinent past medical history.  Past Surgical History:  Procedure Laterality Date  . ABDOMINAL HYSTERECTOMY    . BACK SURGERY     2006: L4,L5    Prescriptions Prior to Admission  Medication Sig Dispense Refill Last Dose  . buPROPion (WELLBUTRIN XL) 150 MG 24 hr tablet Take 150 mg by mouth daily.   Past Week at Unknown time  . butalbital-aspirin-caffeine (FIORINAL) 50-325-40 MG capsule Take 1 capsule by mouth every 4 (four) hours as needed for headache.  0 more than 30 days  . estradiol (ESTRACE) 0.1 MG/GM vaginal cream Place 1 Applicatorful vaginally 3 (three) times a week.   Past Month at Unknown time  . FLUoxetine (PROZAC) 20 MG capsule Take 20 mg by mouth daily.   Past Week at Unknown time  . ipratropium (ATROVENT) 0.06 % nasal spray Place 2 sprays into both nostrils 4 (four) times daily.  10 Past Month at Unknown time  . levothyroxine (SYNTHROID, LEVOTHROID) 75 MCG tablet Take 75 mcg by mouth daily.   Past Week at Unknown time  . omeprazole (PRILOSEC) 40 MG capsule Take 40 mg by mouth daily.   Past Week at Unknown time  . zolpidem (AMBIEN) 10 MG tablet Take 10  mg by mouth at bedtime.   08/25/2016 at Unknown time   No Known Allergies  Social History  Substance Use Topics  . Smoking status: Former Research scientist (life sciences)  . Smokeless tobacco: Former Systems developer  . Alcohol use 4.2 oz/week    7 Glasses of wine per week    History reviewed. No pertinent family history.   Review of Systems A  comprehensive review of systems was negative.  Objective: Vital signs in last 24 hours: Temp:  [97.6 F (36.4 C)-98.3 F (36.8 C)] 97.6 F (36.4 C) (04/09 0544) Pulse Rate:  [82-100] 82 (04/09 0544) Resp:  [16-18] 18 (04/09 0544) BP: (107-159)/(70-106) 159/87 (04/09 0544) SpO2:  [93 %-97 %] 97 % (04/09 0544) Weight:  [63 kg (139 lb)] 63 kg (139 lb) (04/08 1817)  EXAM: Patient is a well developed well-nourished white female in discomfort, but no acute distress. Lungs are clear to auscultation , the patient has symmetrical respiratory excursion. Heart has a regular rate and rhythm normal S1 and S2 no murmur.   Abdomen is soft nontender nondistended bowel sounds are present. Extremity examination shows no clubbing cyanosis or edema. Motor examination shows 5 over 5 strength in the lower extremities including the iliopsoas quadriceps dorsiflexor extensor hallicus  longus and plantar flexor bilaterally. Sensation is intact to pinprick in the distal lower extremities. Reflexes are symmetrical bilaterally. No pathologic reflexes are present. Gait and stance are not tested due to the nature the patient's condition.  Data Review:CBC    Component Value Date/Time   WBC 9.5 08/26/2016 1713   RBC 4.46 08/26/2016 1713   HGB 13.8 08/26/2016 1713   HCT 40.3 08/26/2016 1713   PLT 277 08/26/2016 1713   MCV 90.4 08/26/2016 1713   MCH 30.9 08/26/2016 1713   MCHC 34.2 08/26/2016 1713   RDW 13.7 08/26/2016 1713   LYMPHSABS 1.2 08/26/2016 1713   MONOABS 0.7 08/26/2016 1713   EOSABS 0.0 08/26/2016 1713   BASOSABS 0.0 08/26/2016 1713                          BMET    Component Value Date/Time   NA 133 (L) 08/26/2016 1713   K 4.2 08/26/2016 1713   CL 100 (L) 08/26/2016 1713   CO2 25 08/26/2016 1713   GLUCOSE 113 (H) 08/26/2016 1713   BUN 14 08/26/2016 1713   CREATININE 0.60 08/26/2016 1713   CALCIUM 9.2 08/26/2016 1713   GFRNONAA >60 08/26/2016 1713   GFRAA >60 08/26/2016 1713      Assessment/Plan: Patient with disabling left lumbar radiculopathy secondary to a left L5-S1 lumbar disc herniation with thecal sac and nerve root compression. Pain is persistent in fact worsen despite a 2 week course of prednisone from her primary physician. Even with parenteral narcotic medication she is still having pain. We discussed the MRI findings and alternatives for treatment and care. Although it would be difficult, it is possible she could be able to return to [facility], but she explains to me that in fact she has no family there, and she would prefer to have her treatment and care here in [place], where she could recuperate with her daughter and her daughter's family.  From a surgical perspective she would require a left L5-S1 lumbar laminotomy and microdiscectomy. I discussed the nature of her condition and the nature of the surgical procedure. We discussed the surgery, hospital stay, and recuperation. We discussed limitations during the postoperative period. We discussed risks of surgery  including risks of infection, bleeding, possible need for transfusion, the risk of nerve root dysfunction with pain, weakness, numbness, or tingling, the risk of recurrent disc herniation and the possible need for further surgery, the risk of dural tear and CSF leakage and the possible need for further surgery, and anesthetic risks of myocardial infarction, stroke, pneumonia, and death. After discussing all of this the patient would like to proceed with surgery, I will contact the operating room regarding scheduling.   Hosie Spangle, MD 08/27/2016 6:43 AM

## 2016-08-28 ENCOUNTER — Encounter (HOSPITAL_COMMUNITY): Payer: Self-pay | Admitting: Neurosurgery

## 2016-08-28 MED ORDER — HYDROCODONE-ACETAMINOPHEN 5-325 MG PO TABS: 1.0000 | ORAL_TABLET | ORAL | 0 refills | Status: DC | PRN

## 2016-08-28 MED ORDER — HYDROCODONE-ACETAMINOPHEN 5-325 MG PO TABS
1.0000 | ORAL_TABLET | ORAL | Status: DC | PRN
  Administered 2016-08-28: 1 via ORAL
  Filled 2016-08-28: qty 1

## 2016-08-28 NOTE — Progress Notes (Signed)
Pt doing well. Pt given D/C instructions with Rx's, verbal understanding was provided. Pt's IV was removed prior to D/C. Pt's incision is open to air with no sign of infection. Pt D/C'd home via wheelchair @ 1620 per MD order. Pt is stable @ D/C and has no other needs at this time. Holli Humbles, RN

## 2016-08-28 NOTE — Discharge Summary (Signed)
Physician Discharge Summary  Patient ID: Jeanette Butler MRN: [id number] DOB/AGE: 22-Feb-1927 83 y.o.  Admit date: 08/26/2016 Discharge date: 08/28/2016  Admission Diagnoses:  Left L5-S1 lumbar disc nation, lumbar degenerative disease, lumbar spondylosis, left lumbar radiculopathy  Discharge Diagnoses:  Left L5-S1 lumbar disc nation, lumbar degenerative disease, lumbar spondylosis, left lumbar radiculopathy Active Problems:   Herniated nucleus pulposus   HNP (herniated nucleus pulposus), lumbar   Discharged Condition: good  Hospital Course: Patient presented to the [facility] emergency room with disabling acute left lumbar radiculopathy. MRI was performed and a left L5-S1 lumbar disc herniation, with significant thecal sac and nerve root compression was found. Neurosurgical consultation was requested, and the patient was admitted to [facility], and subsequently taken to surgery for a left L5-S1 lumbar laminotomy and microsurgical discectomy. Postoperatively she is an excellent relief of her left lumbar radicular pain. Her dressing was removed and her wound is healing nicely. There is no erythema, swelling, ecchymosis, or drainage. She is up and ambulating actively in the halls. She is voiding well. She is being discharged home with instructions regarding wound care and activities following discharge. We have asked her to contact the office and schedule an appointment for follow-up with me in 3 weeks.  Discharge Exam: Blood pressure (!) 133/95, pulse 90, temperature 98 F (36.7 C), temperature source Oral, resp. rate 18, height 5\' 5"  (1.651 m), weight 63 kg (139 lb), SpO2 96 %.  Disposition:  Home  Discharge Instructions    Discharge wound care:    Complete by:  As directed    Leave the wound open to air. Shower daily with the wound uncovered. Water and soapy water should run over the incision area. Do not wash directly on the incision for 2 weeks. Remove the glue after 2 weeks.   Driving  Restrictions    Complete by:  As directed    No driving for 2 weeks. May ride in the car locally now. May begin to drive locally in 2 weeks.   Other Restrictions    Complete by:  As directed    Walk gradually increasing distances out in the fresh air at least twice a day. Walking additional 6 times inside the house, gradually increasing distances, daily. No bending, lifting, or twisting. Perform activities between shoulder and waist height (that is at counter height when standing or table height when sitting).     Allergies as of 08/28/2016   No Known Allergies     Medication List    TAKE these medications   buPROPion 150 MG 24 hr tablet Commonly known as:  WELLBUTRIN XL Take 150 mg by mouth daily.   butalbital-aspirin-caffeine 50-325-40 MG capsule Commonly known as:  FIORINAL Take 1 capsule by mouth every 4 (four) hours as needed for headache.   estradiol 0.1 MG/GM vaginal cream Commonly known as:  ESTRACE Place 1 Applicatorful vaginally 3 (three) times a week.   FLUoxetine 20 MG capsule Commonly known as:  PROZAC Take 20 mg by mouth daily.   HYDROcodone-acetaminophen 5-325 MG tablet Commonly known as:  NORCO/VICODIN Take 1-2 tablets by mouth every 4 (four) hours as needed for moderate pain.   ipratropium 0.06 % nasal spray Commonly known as:  ATROVENT Place 2 sprays into both nostrils 4 (four) times daily.   levothyroxine 75 MCG tablet Commonly known as:  SYNTHROID, LEVOTHROID Take 75 mcg by mouth daily.   omeprazole 40 MG capsule Commonly known as:  PRILOSEC Take 40 mg by mouth daily.  zolpidem 10 MG tablet Commonly known as:  AMBIEN Take 10 mg by mouth at bedtime.        SignedHosie Spangle 08/28/2016, 8:11 AM

## 2016-08-28 NOTE — Discharge Instructions (Signed)

## 2016-11-02 DIAGNOSIS — E559 Vitamin D deficiency, unspecified: Secondary | ICD-10-CM | POA: Diagnosis not present

## 2016-11-02 DIAGNOSIS — E039 Hypothyroidism, unspecified: Secondary | ICD-10-CM | POA: Diagnosis not present

## 2016-11-02 DIAGNOSIS — E538 Deficiency of other specified B group vitamins: Secondary | ICD-10-CM | POA: Diagnosis not present

## 2016-11-02 DIAGNOSIS — E782 Mixed hyperlipidemia: Secondary | ICD-10-CM | POA: Diagnosis not present

## 2016-11-02 DIAGNOSIS — R799 Abnormal finding of blood chemistry, unspecified: Secondary | ICD-10-CM | POA: Diagnosis not present

## 2016-11-09 DIAGNOSIS — E039 Hypothyroidism, unspecified: Secondary | ICD-10-CM | POA: Diagnosis not present

## 2016-11-09 DIAGNOSIS — R5383 Other fatigue: Secondary | ICD-10-CM | POA: Diagnosis not present

## 2016-11-09 DIAGNOSIS — F321 Major depressive disorder, single episode, moderate: Secondary | ICD-10-CM | POA: Diagnosis not present

## 2016-11-09 DIAGNOSIS — E538 Deficiency of other specified B group vitamins: Secondary | ICD-10-CM | POA: Diagnosis not present

## 2016-11-09 DIAGNOSIS — K219 Gastro-esophageal reflux disease without esophagitis: Secondary | ICD-10-CM | POA: Diagnosis not present

## 2016-11-09 DIAGNOSIS — G4709 Other insomnia: Secondary | ICD-10-CM | POA: Diagnosis not present

## 2016-11-09 DIAGNOSIS — Z0001 Encounter for general adult medical examination with abnormal findings: Secondary | ICD-10-CM | POA: Diagnosis not present

## 2016-11-09 DIAGNOSIS — Z6822 Body mass index (BMI) 22.0-22.9, adult: Secondary | ICD-10-CM | POA: Diagnosis not present

## 2016-11-09 DIAGNOSIS — J449 Chronic obstructive pulmonary disease, unspecified: Secondary | ICD-10-CM | POA: Diagnosis not present

## 2017-01-10 NOTE — Addendum Note (Signed)
Addendum  created 01/10/17 1251 by Roberts Gaudy, MD   Sign clinical note

## 2017-01-24 DIAGNOSIS — Z885 Allergy status to narcotic agent status: Secondary | ICD-10-CM | POA: Diagnosis not present

## 2017-01-24 DIAGNOSIS — E039 Hypothyroidism, unspecified: Secondary | ICD-10-CM | POA: Diagnosis not present

## 2017-01-24 DIAGNOSIS — K625 Hemorrhage of anus and rectum: Secondary | ICD-10-CM | POA: Diagnosis not present

## 2017-01-24 DIAGNOSIS — K219 Gastro-esophageal reflux disease without esophagitis: Secondary | ICD-10-CM | POA: Diagnosis not present

## 2017-01-24 DIAGNOSIS — I491 Atrial premature depolarization: Secondary | ICD-10-CM | POA: Diagnosis not present

## 2017-02-12 DIAGNOSIS — N390 Urinary tract infection, site not specified: Secondary | ICD-10-CM | POA: Diagnosis not present

## 2017-02-12 DIAGNOSIS — R351 Nocturia: Secondary | ICD-10-CM | POA: Diagnosis not present

## 2017-02-12 DIAGNOSIS — N3021 Other chronic cystitis with hematuria: Secondary | ICD-10-CM | POA: Diagnosis not present

## 2017-02-13 DIAGNOSIS — Z23 Encounter for immunization: Secondary | ICD-10-CM | POA: Diagnosis not present

## 2017-02-14 DIAGNOSIS — K921 Melena: Secondary | ICD-10-CM | POA: Diagnosis not present

## 2017-02-22 DIAGNOSIS — K635 Polyp of colon: Secondary | ICD-10-CM | POA: Diagnosis not present

## 2017-02-22 DIAGNOSIS — K921 Melena: Secondary | ICD-10-CM | POA: Diagnosis not present

## 2017-02-22 DIAGNOSIS — K5730 Diverticulosis of large intestine without perforation or abscess without bleeding: Secondary | ICD-10-CM | POA: Diagnosis not present

## 2017-02-22 DIAGNOSIS — D123 Benign neoplasm of transverse colon: Secondary | ICD-10-CM | POA: Diagnosis not present

## 2017-02-22 DIAGNOSIS — K642 Third degree hemorrhoids: Secondary | ICD-10-CM | POA: Diagnosis not present

## 2017-03-28 DIAGNOSIS — H35363 Drusen (degenerative) of macula, bilateral: Secondary | ICD-10-CM | POA: Diagnosis not present

## 2017-03-28 DIAGNOSIS — H26491 Other secondary cataract, right eye: Secondary | ICD-10-CM | POA: Diagnosis not present

## 2017-03-28 DIAGNOSIS — H43811 Vitreous degeneration, right eye: Secondary | ICD-10-CM | POA: Diagnosis not present

## 2017-04-28 IMAGING — MR MR LUMBAR SPINE W/O CM
4 of 5 series · 18 of 48 positions shown · non-contrast
Comparison: None available.

CLINICAL DATA: Initial evaluation for left lumbar radiculopathy.

EXAM:
MRI LUMBAR SPINE WITHOUT CONTRAST
TECHNIQUE: Multiplanar, multisequence MR imaging of the lumbar spine was
performed. No intravenous contrast was administered.

[Series 3: T1 · sagittal · 4.0mm · 0.51mm/px · 3 of 13 slices shown (1 of 2)]
[im 3/13]
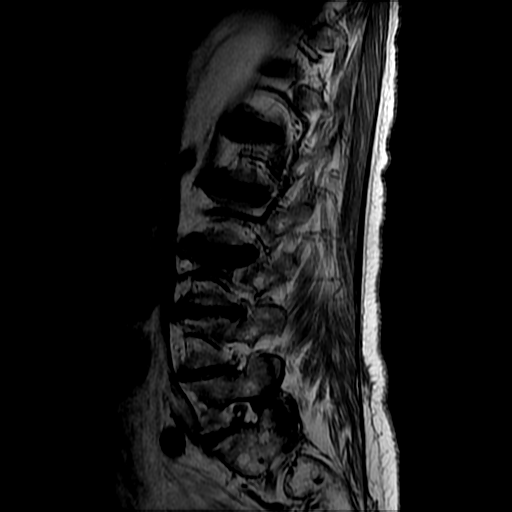
[im 8/13]
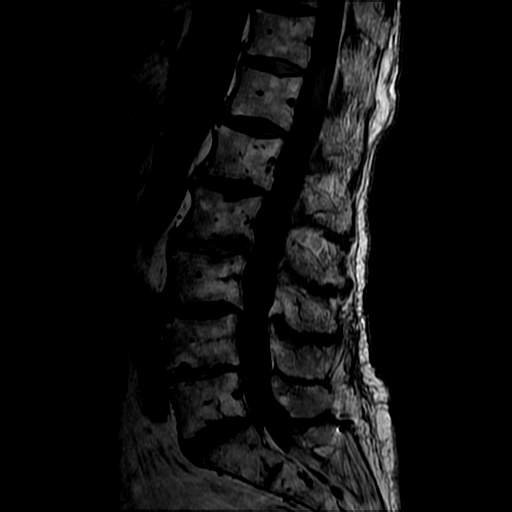
[im 13/13]
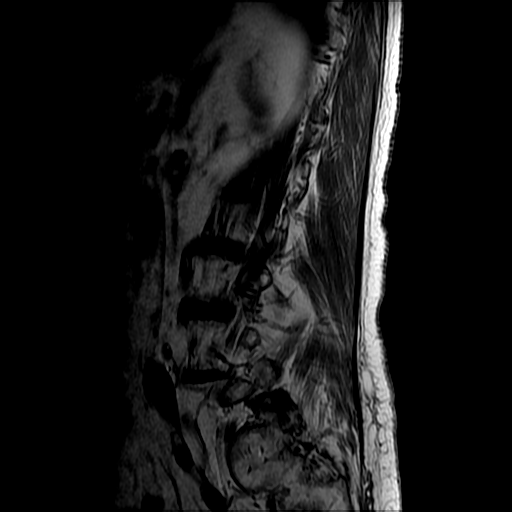

[Series 4: T2 post-contrast · sagittal · 4.0mm · 0.51mm/px · 5 of 13 slices shown]
[im 1/13]
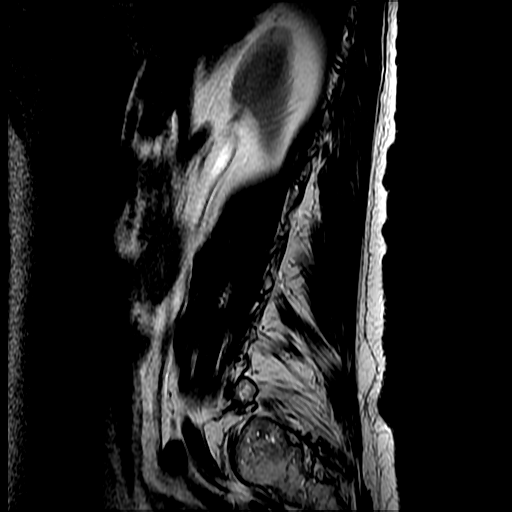
[im 4/13]
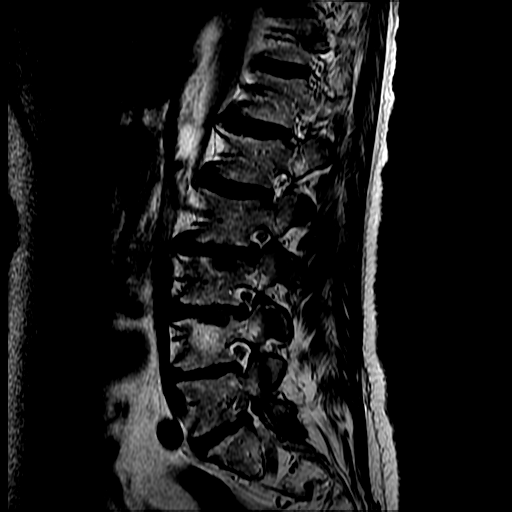
[im 7/13]
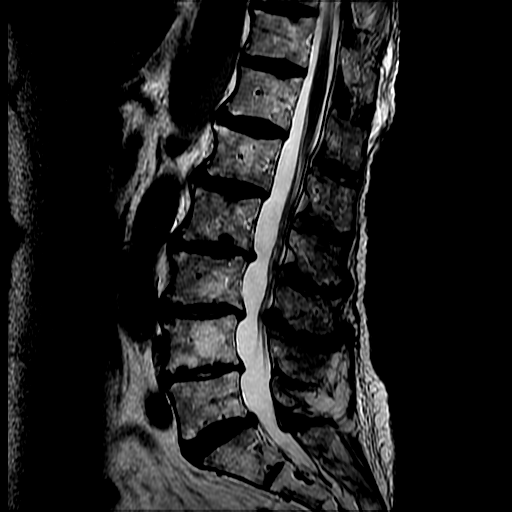
[im 10/13]
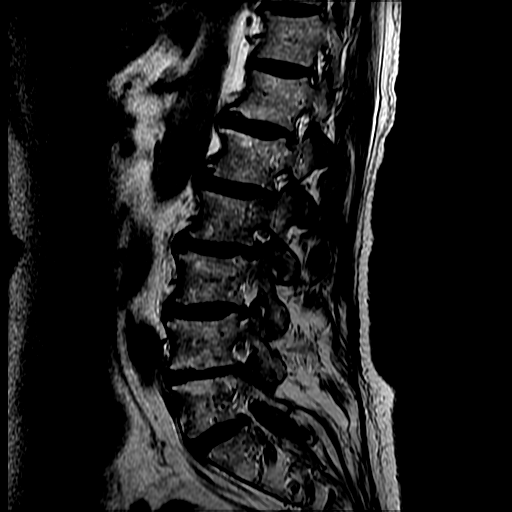
[im 13/13]
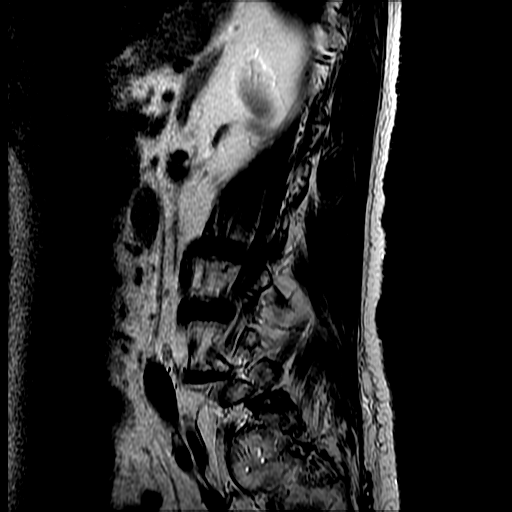

[Series 6: T2 · axial · 4.0mm · 0.41mm/px · z∈[-113,+67]mm · 7 of 41 slices shown]
[im 3/41]
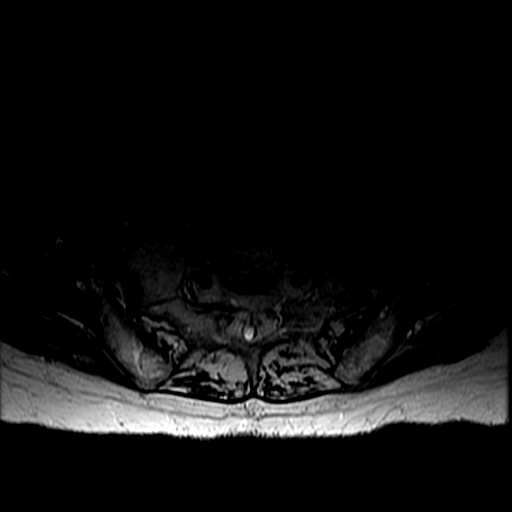
[im 6/41]
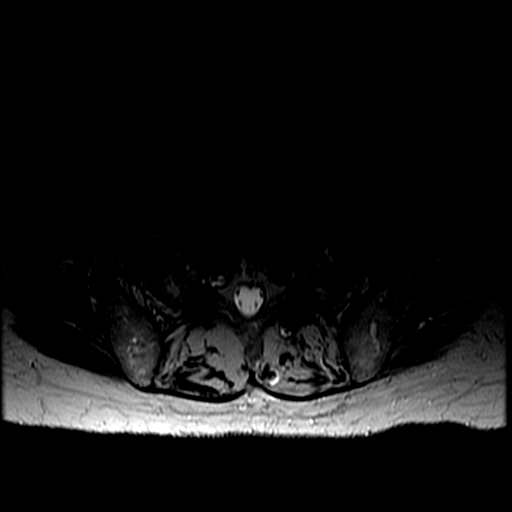
[im 9/41]
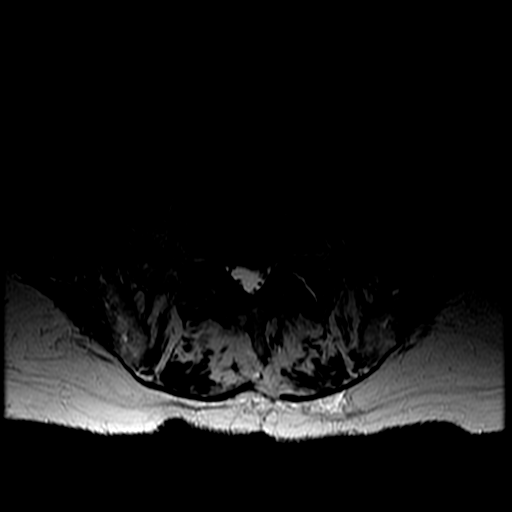
[im 14/41]
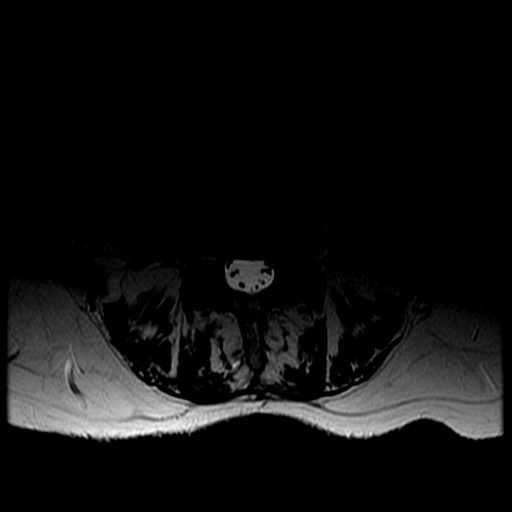
[im 19/41]
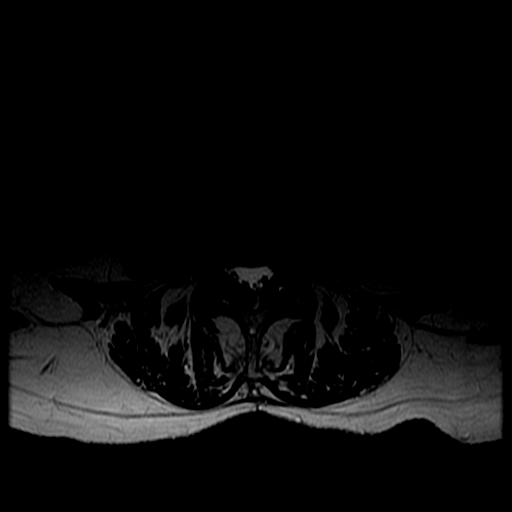
[im 22/41]
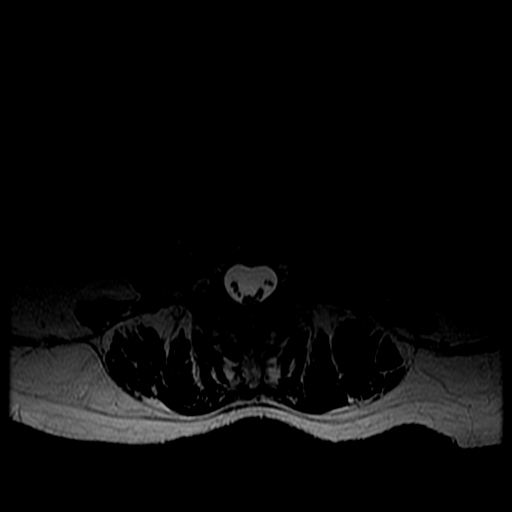
[im 35/41]
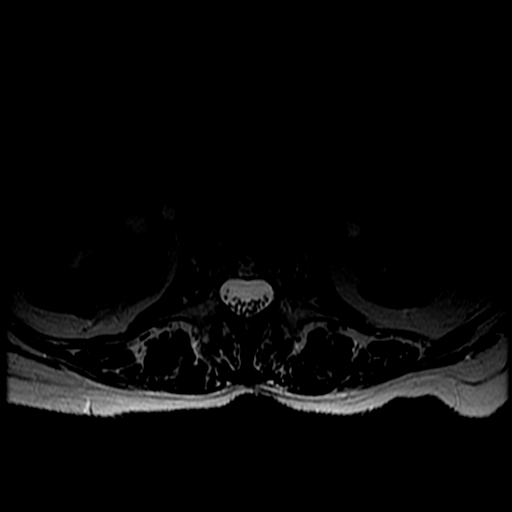

[Series 7: T1 · axial · 4.0mm · 0.41mm/px · z∈[-98,+67]mm · 3 of 41 slices shown (2 of 2)]
[im 6/41]
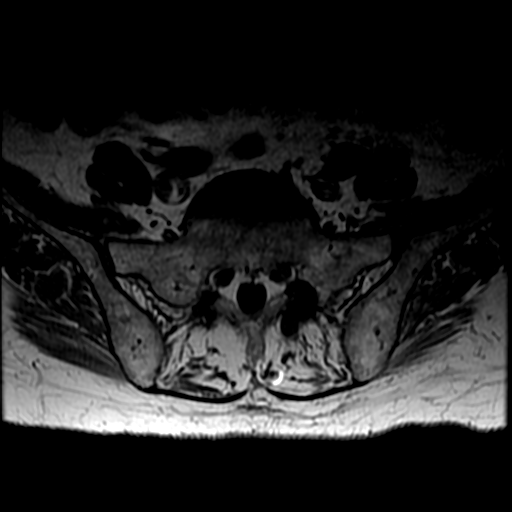
[im 22/41]
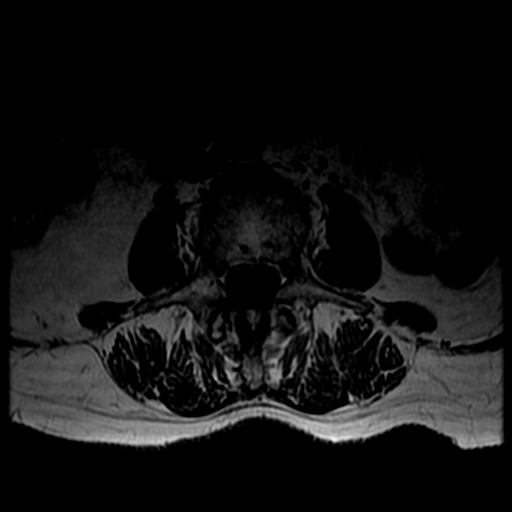
[im 35/41]
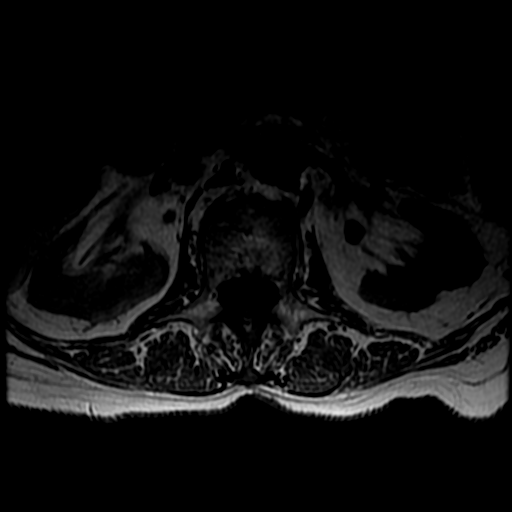

[18 of 48 positions shown; findings below may reference images not displayed]

FINDINGS: Segmentation: Normal segmentation. Lowest well-formed disc is
labeled the L5-S1 level.

Alignment: Trace anterolisthesis of L5 on S1. Trace retrolisthesis
of L1 on L2 and L2 on L3. Vertebral bodies otherwise normally
aligned with preservation of the normal lumbar lordosis.

Vertebrae: Vertebral body heights are maintained. No evidence for
acute or chronic fracture. Multiple scattered chronic endplate
Schmorl's nodes noted throughout the lumbar spine. Benign hemangioma
noted within the L4 vertebral body. Signal intensity within the
vertebral body bone marrow mildly heterogeneous but within normal
limits. No worrisome osseous lesion.

Conus medullaris: Extends to the L1 level and appears normal.

Paraspinal and other soft tissues: Paraspinous soft tissues within
normal limits. T2 hyperintense cyst noted within the right kidney.
Visualized vessel structures otherwise unremarkable.

Disc levels:

L1-2: The mild diffuse disc bulge with disc desiccation and
intervertebral disc space narrowing. Disc bulging slightly eccentric
to the left without focal disc protrusion. No significant stenosis
or evidence for neural impingement.

L2-3: Diffuse degenerative disc bulge with disc desiccation and
intervertebral disc space narrowing. Disc bulging eccentric to the
left without focal disc protrusion. No significant canal stenosis.
Mild to moderate left foraminal stenosis, with the bulging disc
closely approximating the exiting left L2 nerve root (series 3,
image 11). No significant right foraminal stenosis.

L3-4: Diffuse degenerative disc bulge with disc desiccation and
intervertebral disc space narrowing. Mild facet and ligamentum
flavum hypertrophy. Resultant mild canal and bilateral lateral
recess narrowing. No significant foraminal encroachment.

L4-5: Diffuse degenerative disc bulge with disc desiccation and
intervertebral disc space narrowing. No significant canal or lateral
recess stenosis. Mild left foraminal narrowing.

L5-S1: Diffuse degenerative disc bulge with disc desiccation and
intervertebral disc space narrowing. There is a superimposed left
subarticular disc extrusion with inferior migration (series 6, image
34). Extruded disc material encroaches upon the left lateral recess,
likely impinging upon the transiting left S1 nerve root.
Superimposed mild facet arthrosis. Mild bilateral foraminal
stenosis.
IMPRESSION: 1. Left subarticular disc extrusion with inferior migration at
L5-S1, impinging upon the descending left S1 nerve root in the left
lateral recess.
2. Left E centric disc bulge at L2-3, closely approximating and
potentially irritating the exiting left L2 nerve root.
3. Additional mild for age multilevel degenerative spondylolysis as
above. No other significant stenosis or evidence for neural
impingement.

## 2017-08-05 ENCOUNTER — Ambulatory Visit: Payer: Medicare Other | Admitting: Family Medicine

## 2017-09-09 ENCOUNTER — Encounter: Payer: Self-pay | Admitting: Family Medicine

## 2017-09-09 ENCOUNTER — Ambulatory Visit (INDEPENDENT_AMBULATORY_CARE_PROVIDER_SITE_OTHER): Payer: Medicare Other | Admitting: Family Medicine

## 2017-09-09 VITALS — BP 130/80 | HR 114 | Temp 97.9°F | Ht 65.5 in | Wt 146.0 lb

## 2017-09-09 DIAGNOSIS — F419 Anxiety disorder, unspecified: Secondary | ICD-10-CM

## 2017-09-09 DIAGNOSIS — F325 Major depressive disorder, single episode, in full remission: Secondary | ICD-10-CM | POA: Diagnosis not present

## 2017-09-09 DIAGNOSIS — N952 Postmenopausal atrophic vaginitis: Secondary | ICD-10-CM

## 2017-09-09 DIAGNOSIS — J309 Allergic rhinitis, unspecified: Secondary | ICD-10-CM

## 2017-09-09 DIAGNOSIS — L989 Disorder of the skin and subcutaneous tissue, unspecified: Secondary | ICD-10-CM | POA: Diagnosis not present

## 2017-09-09 DIAGNOSIS — K219 Gastro-esophageal reflux disease without esophagitis: Secondary | ICD-10-CM

## 2017-09-09 DIAGNOSIS — E039 Hypothyroidism, unspecified: Secondary | ICD-10-CM | POA: Diagnosis not present

## 2017-09-09 DIAGNOSIS — G43909 Migraine, unspecified, not intractable, without status migrainosus: Secondary | ICD-10-CM | POA: Diagnosis not present

## 2017-09-09 NOTE — Assessment & Plan Note (Signed)
Stable.  Continue Prozac 20 mg daily. 

## 2017-09-09 NOTE — Assessment & Plan Note (Signed)
Stable.  Continue estradiol cream as needed.

## 2017-09-09 NOTE — Patient Instructions (Signed)
It was very nice to meet you today!  I think we should biopsy the spot on your leg to make sure it isnt cancer or pre-cancer. Please let me know when you would like to have this done.  No other medicaiton changes today.  I would like to get your prior records.    Please come back to see me in 6-12 months, or sooner as needed.  Take care, Dr Jerline Pain

## 2017-09-09 NOTE — Progress Notes (Signed)
Subjective:  Jeanette Butler is a 84 y.o. female who presents today with a chief complaint of skin lesion and to establish care.   HPI:  Skin Lesion, New Problem Patient first noticed this about 3 months ago.  Lesion is located on her left lower leg.  She thought that this was due to an ingrown hair that continue to get irritated due to shaving.  Symptoms have been stable over the last several weeks.  She has had some surrounding redness that has been stable.  She has not tried any treatments for this.  Depression and anxiety, chronic problems, new to this provider Symptoms are stable.  She is on Wellbutrin 150 mg daily and Prozac 20 mg daily.  Tolerates both these well without side effects.  Hypothyroidism, chronic problem, new to this provider Stable on Synthroid 75 mcg daily.  Insomnia, chronic problem, new to this providee Currently on Ambien 10 mg daily.  She has been on this dose for several years and tolerates well without side effects.  GERD, chronic problem, new to this provider Takes Prilosec 40 mg daily.  Tolerates this well without side effects.  Atrophic vaginitis, chronic problem, new risk factors Intermittently uses estradiol cream which helps with her symptoms.  Migraines, chronic problem, new to this provider Very infrequently uses Fiorinal.  Has not had to use for several years.  No current headache.  Allergic rhinitis, chronic problem, new to this provider Symptoms worsen since moving to [place].  Uses Flonase daily which helps.  ROS: Per HPI, otherwise a complete review of systems was negative.   PMH:  The following were reviewed and entered/updated in epic: Past Medical History:  Diagnosis Date  . Allergic rhinitis   . Anxiety   . Atrophic vaginitis   . Depression   . GERD (gastroesophageal reflux disease)   . Hypothyroidism   . Migraines   . Recurrent UTI    Patient Active Problem List   Diagnosis Date Noted  . Major depression in remission  ([place]) 09/09/2017  . Anxiety 09/09/2017  . Hypothyroidism 09/09/2017  . GERD (gastroesophageal reflux disease) 09/09/2017  . Atrophic vaginitis 09/09/2017  . Migraines 09/09/2017  . Allergic rhinitis 09/09/2017  . HNP (herniated nucleus pulposus), lumbar 08/27/2016  . Herniated nucleus pulposus 08/26/2016   Past Surgical History:  Procedure Laterality Date  . ABDOMINAL HYSTERECTOMY    . BACK SURGERY     2006: L4,L5  . LUMBAR LAMINECTOMY/DECOMPRESSION MICRODISCECTOMY Left 08/27/2016   Procedure: LUMBAR LAMINECTOMY/DECOMPRESSION MICRODISCECTOMY LUMBAR FIVE-SACRAL ONE LEFT;  Surgeon: Jovita Gamma, MD;  Location: [facility];  Service: Neurosurgery;  Laterality: Left;   Family History  Problem Relation Age of Onset  . Skin cancer Neg Hx     Medications- reviewed and updated Current Outpatient Medications  Medication Sig Dispense Refill  . buPROPion (WELLBUTRIN XL) 150 MG 24 hr tablet Take 150 mg by mouth daily.    . butalbital-aspirin-caffeine (FIORINAL) 50-325-40 MG capsule Take 1 capsule by mouth every 4 (four) hours as needed for headache.  0  . estradiol (ESTRACE) 0.1 MG/GM vaginal cream Place 1 Applicatorful vaginally 3 (three) times a week.    Marland Kitchen FLUoxetine (PROZAC) 20 MG capsule Take 20 mg by mouth daily.    . fluticasone (FLONASE) 50 MCG/ACT nasal spray Place into both nostrils daily.    Marland Kitchen levothyroxine (SYNTHROID, LEVOTHROID) 75 MCG tablet Take 75 mcg by mouth daily.    Marland Kitchen omeprazole (PRILOSEC) 40 MG capsule Take 40 mg by mouth daily.    Marland Kitchen  zolpidem (AMBIEN) 10 MG tablet Take 10 mg by mouth at bedtime.     No current facility-administered medications for this visit.     Allergies-reviewed and updated No Known Allergies  Social History   Socioeconomic History  . Marital status: Widowed    Spouse name: Not on file  . Number of children: Not on file  . Years of education: Not on file  . Highest education level: Not on file  Occupational History  . Not on file  Social Needs   . Financial resource strain: Not on file  . Food insecurity:    Worry: Not on file    Inability: Not on file  . Transportation needs:    Medical: Not on file    Non-medical: Not on file  Tobacco Use  . Smoking status: Former Research scientist (life sciences)  . Smokeless tobacco: Former Network engineer and Sexual Activity  . Alcohol use: Yes    Alcohol/week: 4.2 oz    Types: 7 Glasses of wine per week  . Drug use: No  . Sexual activity: Not on file  Lifestyle  . Physical activity:    Days per week: Not on file    Minutes per session: Not on file  . Stress: Not on file  Relationships  . Social connections:    Talks on phone: Not on file    Gets together: Not on file    Attends religious service: Not on file    Active member of club or organization: Not on file    Attends meetings of clubs or organizations: Not on file    Relationship status: Not on file  Other Topics Concern  . Not on file  Social History Narrative  . Not on file   Objective:  Physical Exam: BP 130/80 (BP Location: Left Arm)   Pulse (!) 114   Temp 97.9 F (36.6 C) (Oral)   Ht 5' 5.5" (1.664 m)   Wt 146 lb (66.2 kg)   SpO2 98%   BMI 23.93 kg/m   Gen: NAD, resting comfortably CV: RRR with no murmurs appreciated Pulm: NWOB, CTAB with no crackles, wheezes, or rhonchi GI: Normal bowel sounds present. Soft, Nontender, Nondistended. MSK: No edema, cyanosis, or clubbing noted Skin: Approximately 1cm pigmented, hyperkeratotic lesion on lateral left lower leg. See below picture.  Neuro: Grossly normal, moves all extremities Psych: Normal affect and thought content      Assessment/Plan:  Skin Lesion Concern for squamous cell carcinoma.  Discussed that this was a possibility with patient and recommended punch biopsy, however patient declined.   Major depression in remission ([place]) Stable.  Continue Wellbutrin 150 mg daily and Prozac 20 mg daily.  Anxiety Stable.  Continue Prozac 20 mg daily.  Hypothyroidism Stable.   Continue Synthroid 75 mcg daily.  Obtain most recent labs from PCP.  Will need TSH if not done within the past year.  GERD (gastroesophageal reflux disease) Stable.  Continue Prilosec 40 mg daily.  Atrophic vaginitis Stable.  Continue estradiol cream as needed.  Migraines Stable.  No red flag signs or symptoms.  Continue Fiorinal as needed-patient states that she only takes this very infrequently and has not had a to have anything for the past several months to years.   Allergic rhinitis Stable.  Continue Flonase.  Preventative Healthcare Obtain records from previous PCP.  Patient will return soon for CPE/AWV.  Algis Greenhouse. Jerline Pain, MD 09/09/2017 4:55 PM

## 2017-09-09 NOTE — Assessment & Plan Note (Signed)
Stable.  Continue Prilosec 40 mg daily. 

## 2017-09-09 NOTE — Assessment & Plan Note (Signed)
Stable. Continue Flonase 

## 2017-09-09 NOTE — Assessment & Plan Note (Signed)
Stable.  Continue Wellbutrin 150 mg daily and Prozac 20 mg daily.

## 2017-09-09 NOTE — Assessment & Plan Note (Signed)
Stable.  Continue Synthroid 75 mcg daily.  Obtain most recent labs from PCP.  Will need TSH if not done within the past year.

## 2017-09-09 NOTE — Assessment & Plan Note (Signed)
Stable.  No red flag signs or symptoms.  Continue Fiorinal as needed-patient states that she only takes this very infrequently and has not had a to have anything for the past several months to years.

## 2017-09-20 ENCOUNTER — Telehealth: Payer: Self-pay | Admitting: Family Medicine

## 2017-09-20 NOTE — Telephone Encounter (Signed)
Copied from [place]. Topic: Medicare AWV >> Sep 20, 2017 11:48 AM Rutherford Nail, NT wrote: Reason for CRM: patient calling to schedule physical, but is on medicare. New patient since 09/09/17. CB#: [phone]. States to leave a voice mail if there is no answer.

## 2017-09-20 NOTE — Telephone Encounter (Signed)
Contact patient to discuss scheduling AWV in place of physical due to insurance.

## 2017-09-23 NOTE — Telephone Encounter (Signed)
Please call pt back at [phone]

## 2017-09-24 NOTE — Telephone Encounter (Signed)
Patient scheduled.

## 2017-10-04 ENCOUNTER — Encounter: Payer: Self-pay | Admitting: Family Medicine

## 2017-10-04 ENCOUNTER — Ambulatory Visit (INDEPENDENT_AMBULATORY_CARE_PROVIDER_SITE_OTHER): Payer: Medicare Other | Admitting: Family Medicine

## 2017-10-04 ENCOUNTER — Other Ambulatory Visit: Payer: Self-pay

## 2017-10-04 VITALS — BP 120/74 | HR 81 | Temp 98.1°F | Resp 16 | Ht 65.5 in | Wt 147.5 lb

## 2017-10-04 DIAGNOSIS — R350 Frequency of micturition: Secondary | ICD-10-CM

## 2017-10-04 DIAGNOSIS — R300 Dysuria: Secondary | ICD-10-CM

## 2017-10-04 DIAGNOSIS — G4700 Insomnia, unspecified: Secondary | ICD-10-CM | POA: Diagnosis not present

## 2017-10-04 DIAGNOSIS — G43909 Migraine, unspecified, not intractable, without status migrainosus: Secondary | ICD-10-CM | POA: Diagnosis not present

## 2017-10-04 LAB — POC URINALSYSI DIPSTICK (AUTOMATED)
Bilirubin, UA: NEGATIVE
GLUCOSE UA: NEGATIVE
KETONES UA: NEGATIVE
Nitrite, UA: NEGATIVE
Protein, UA: NEGATIVE
SPEC GRAV UA: 1.015 (ref 1.010–1.025)
Urobilinogen, UA: 0.2 E.U./dL
pH, UA: 6.5 (ref 5.0–8.0)

## 2017-10-04 MED ORDER — CEPHALEXIN 500 MG PO CAPS: 500.0000 mg | ORAL_CAPSULE | Freq: Two times a day (BID) | ORAL | 0 refills | Status: AC

## 2017-10-04 MED ORDER — BUTALBITAL-ASPIRIN-CAFFEINE 50-325-40 MG PO CAPS: 1.0000 | ORAL_CAPSULE | ORAL | 0 refills | Status: DC | PRN

## 2017-10-04 MED ORDER — ZOLPIDEM TARTRATE 10 MG PO TABS: 10.0000 mg | ORAL_TABLET | Freq: Every day | ORAL | 0 refills | Status: DC

## 2017-10-04 MED ORDER — MIRABEGRON ER 25 MG PO TB24: 25.0000 mg | ORAL_TABLET | Freq: Every day | ORAL | 5 refills | Status: DC

## 2017-10-04 NOTE — Assessment & Plan Note (Signed)
Stable.  Refilled Ambien today.  Also advised patient to take Prozac at night to see if this helps.  Hopefully will also have some improvement as we treat her urinary frequency and UTI.

## 2017-10-04 NOTE — Progress Notes (Signed)
    Subjective:  Jeanette Butler is a 84 y.o. female who presents today for same-day appointment with a chief complaint of dysuria.   HPI:  Dysuria, acute problem Associated with urinary frequency.  Feels similar to past UTIs.  Occasional chills.  No muscle aches.  No home treatments tried.  Urinary frequency, chronic problem, new to provider Several year history.  Has been on medications for this in the past but does not remember the name.  Reports having full neurologic work-up in [place] which was essentially negative.  Symptoms have worsened recently.  Insomnia, chronic problem, stable Patient wakes up several times at night to urinate.  She is taking Ambien 10 mg nightly which helps with her symptoms.  No reported side effects.  Headache disorder, chronic problem, stable Very infrequently uses Fiorinal as needed.  No reported headache currently.  No reported side effects of medication.   ROS: Per HPI  PMH: She reports that she has quit smoking. She has quit using smokeless tobacco. She reports that she drinks about 4.2 oz of alcohol per week. She reports that she does not use drugs.  Objective:  Physical Exam: BP 120/74 (BP Location: Left Arm, Patient Position: Sitting, Cuff Size: Normal)   Pulse 81   Temp 98.1 F (36.7 C) (Oral)   Resp 16   Ht 5' 5.5" (1.664 m)   Wt 147 lb 8 oz (66.9 kg)   SpO2 98%   BMI 24.17 kg/m   Gen: NAD, resting comfortably CV: RRR with no murmurs appreciated Pulm: NWOB, CTAB with no crackles, wheezes, or rhonchi GI: Normal bowel sounds present. Soft, Nontender, Nondistended. MSK: No CVA tenderness Skin: Warm, dry Neuro: Grossly normal, moves all extremities Psych: Normal affect and thought content  Results for orders placed or performed in visit on 10/04/17 (from the past 24 hour(s))  POCT Urinalysis Dipstick (Automated)     Status: Abnormal   Collection Time: 10/04/17 11:32 AM  Result Value Ref Range   Color, UA Yellow    Clarity, UA Cloudy      Glucose, UA Negative    Bilirubin, UA Negative    Ketones, UA Negative    Spec Grav, UA 1.015 1.010 - 1.025   Blood, UA Trace    pH, UA 6.5 5.0 - 8.0   Protein, UA Negative    Urobilinogen, UA 0.2 0.2 or 1.0 E.U./dL   Nitrite, UA Negative    Leukocytes, UA Large (3+) (A) Negative   Assessment/Plan:  Dysuria UA consistent with UTI.  Start Keflex 500 mg twice daily for 7 days.  Check urine culture.  Urinary frequency I will have some improvement as we treat her UTI.  Will start Myrbetriq it seems like she has been diagnosed with overactive bladder in the past.  If this medication is cost prohibitive, would consider oxybutynin.  Insomnia Stable.  Refilled Ambien today.  Also advised patient to take Prozac at night to see if this helps.  Hopefully will also have some improvement as we treat her urinary frequency and UTI.  Migraines Stable.  No red flag signs or symptoms.  Continue Fiorinal as needed.    Algis Greenhouse. Jerline Pain, MD 10/04/2017 12:13 PM

## 2017-10-04 NOTE — Patient Instructions (Signed)
It was nice to see you today!  You have a urinary tract infection.  Please start Keflex 1 pill twice daily for the next week.  Please start the Myrbetriq.  Let me know if it is too expensive and we can send in an alternative.  We will call you if we need to switch antibiotics.  I sent in a refill of your Ambien and Fiorinal.  Please keep an eye on the spot on your leg.  Please let me know if your symptoms worsen or do not improve over the next few weeks.  Take care, Dr Jerline Pain

## 2017-10-04 NOTE — Assessment & Plan Note (Signed)
Stable.  No red flag signs or symptoms.  Continue Fiorinal as needed.

## 2017-10-04 NOTE — Assessment & Plan Note (Signed)
I will have some improvement as we treat her UTI.  Will start Myrbetriq it seems like she has been diagnosed with overactive bladder in the past.  If this medication is cost prohibitive, would consider oxybutynin.

## 2017-10-07 LAB — URINE CULTURE
MICRO NUMBER:: 90603738
SPECIMEN QUALITY: ADEQUATE

## 2017-10-17 ENCOUNTER — Ambulatory Visit: Payer: Medicare Other | Admitting: *Deleted

## 2017-10-21 ENCOUNTER — Encounter: Payer: Self-pay | Admitting: Family Medicine

## 2017-10-21 ENCOUNTER — Ambulatory Visit: Payer: Medicare Other | Admitting: Family Medicine

## 2017-10-21 DIAGNOSIS — Z0289 Encounter for other administrative examinations: Secondary | ICD-10-CM

## 2017-11-04 ENCOUNTER — Encounter: Payer: Self-pay | Admitting: Family Medicine

## 2017-11-04 ENCOUNTER — Ambulatory Visit (INDEPENDENT_AMBULATORY_CARE_PROVIDER_SITE_OTHER): Payer: Medicare Other | Admitting: Family Medicine

## 2017-11-04 VITALS — BP 118/64 | HR 90 | Temp 98.0°F | Ht 66.0 in | Wt 148.0 lb

## 2017-11-04 DIAGNOSIS — M62838 Other muscle spasm: Secondary | ICD-10-CM

## 2017-11-04 MED ORDER — PREDNISONE 50 MG PO TABS: ORAL_TABLET | ORAL | 0 refills | Status: DC

## 2017-11-04 MED ORDER — BACLOFEN 20 MG PO TABS: 20.0000 mg | ORAL_TABLET | Freq: Three times a day (TID) | ORAL | 0 refills | Status: DC

## 2017-11-04 NOTE — Progress Notes (Signed)
   Subjective:  Jeanette Butler is a 84 y.o. female who presents today for same-day appointment with a chief complaint of neck stiffness.   HPI:  Neck Stiffness, acute problem Started 4 days ago.  Stable over that time.  Patient thinks that she was looking at her iPad in her lap for too long which caused her symptoms.  Patient will look at her iPad in her lap for several hours at a time.  She has difficulty turning her neck from side to side as well as looking up and down.  She has tried several treatments at home including aspirin, ice, Fiorinal, and a heating pad without significant relief.  She has a little bit of pain to her neck.  Denies any weakness or numbness.  No reported bowel or bladder incontinence.  No other obvious aggravating or alleviating factors.  ROS: Per HPI  PMH: She reports that she has quit smoking. She has quit using smokeless tobacco. She reports that she drinks about 4.2 oz of alcohol per week. She reports that she does not use drugs.  Objective:  Physical Exam: BP 118/64 (BP Location: Left Arm, Patient Position: Sitting, Cuff Size: Normal)   Pulse 90   Temp 98 F (36.7 C) (Oral)   Ht 5\' 6"  (1.676 m)   Wt 148 lb (67.1 kg)   SpO2 96%   BMI 23.89 kg/m   Gen: NAD, resting comfortably MSK: -Neck: No gross deformities.  Patient able to turn head to the leftward approximately 30 degrees, and turn head rightward approximately 45 degrees.  Patient with flexion to about 10 degrees in extension to about 20 degrees.  No midline tenderness.  Paraspinal muscular tightness noted. - Upper extremities: No gross deformities.  Strength 5 out of 5 throughout.  Neurovascularly intact distally. -Lower extremities: No gross deformities.  Strength 5 out of 5 throughout.  Neurovascularly intact distally.  Assessment/Plan:  Neck spasm No red flag signs or symptoms.  Symptoms secondary to muscular spasm.  No signs of herniated disc or nerve involvement.  We will start course of prednisone  and baclofen today.  Also discussed gentle stretching exercises over the next several days.  Discussed reasons to return to care.  Follow-up as needed.  Algis Greenhouse. Jerline Pain, MD 11/04/2017 11:42 AM

## 2017-11-04 NOTE — Patient Instructions (Addendum)
It was very nice to see you today!  Please start the prednisone and baclofen.  Please stretch or neck muscles.   Please let me know if your symptoms worsen or do not improve over the next several days.   Take care, Dr Jerline Pain

## 2017-11-05 ENCOUNTER — Ambulatory Visit: Payer: Self-pay | Admitting: Family Medicine

## 2017-11-05 ENCOUNTER — Emergency Department (HOSPITAL_COMMUNITY): Payer: Medicare Other

## 2017-11-05 ENCOUNTER — Encounter (HOSPITAL_COMMUNITY): Payer: Self-pay | Admitting: Pharmacy Technician

## 2017-11-05 ENCOUNTER — Emergency Department (HOSPITAL_COMMUNITY)
Admission: EM | Admit: 2017-11-05 | Discharge: 2017-11-05 | Disposition: A | Discharge status: Home / Self Care | Payer: Medicare Other | Attending: Emergency Medicine | Admitting: Emergency Medicine

## 2017-11-05 ENCOUNTER — Other Ambulatory Visit: Payer: Self-pay

## 2017-11-05 DIAGNOSIS — I10 Essential (primary) hypertension: Secondary | ICD-10-CM | POA: Diagnosis not present

## 2017-11-05 DIAGNOSIS — E039 Hypothyroidism, unspecified: Secondary | ICD-10-CM | POA: Diagnosis not present

## 2017-11-05 DIAGNOSIS — Z87891 Personal history of nicotine dependence: Secondary | ICD-10-CM | POA: Insufficient documentation

## 2017-11-05 DIAGNOSIS — R000 Tachycardia, unspecified: Secondary | ICD-10-CM | POA: Diagnosis not present

## 2017-11-05 DIAGNOSIS — T380X5A Adverse effect of glucocorticoids and synthetic analogues, initial encounter: Secondary | ICD-10-CM | POA: Diagnosis not present

## 2017-11-05 DIAGNOSIS — T50905A Adverse effect of unspecified drugs, medicaments and biological substances, initial encounter: Secondary | ICD-10-CM

## 2017-11-05 DIAGNOSIS — M436 Torticollis: Secondary | ICD-10-CM | POA: Insufficient documentation

## 2017-11-05 DIAGNOSIS — R0902 Hypoxemia: Secondary | ICD-10-CM | POA: Diagnosis not present

## 2017-11-05 DIAGNOSIS — R079 Chest pain, unspecified: Secondary | ICD-10-CM | POA: Diagnosis not present

## 2017-11-05 DIAGNOSIS — K219 Gastro-esophageal reflux disease without esophagitis: Secondary | ICD-10-CM | POA: Diagnosis not present

## 2017-11-05 DIAGNOSIS — R0789 Other chest pain: Secondary | ICD-10-CM | POA: Diagnosis not present

## 2017-11-05 DIAGNOSIS — R7989 Other specified abnormal findings of blood chemistry: Secondary | ICD-10-CM | POA: Diagnosis not present

## 2017-11-05 DIAGNOSIS — Z79899 Other long term (current) drug therapy: Secondary | ICD-10-CM | POA: Insufficient documentation

## 2017-11-05 LAB — CBC WITH DIFFERENTIAL/PLATELET
ABS IMMATURE GRANULOCYTES: 0 10*3/uL (ref 0.0–0.1)
BASOS PCT: 1 %
Basophils Absolute: 0 10*3/uL (ref 0.0–0.1)
Eosinophils Absolute: 0.1 10*3/uL (ref 0.0–0.7)
Eosinophils Relative: 1 %
HEMATOCRIT: 38.6 % (ref 36.0–46.0)
Hemoglobin: 12.2 g/dL (ref 12.0–15.0)
Immature Granulocytes: 0 %
Lymphocytes Relative: 22 %
Lymphs Abs: 1.7 10*3/uL (ref 0.7–4.0)
MCH: 29.2 pg (ref 26.0–34.0)
MCHC: 31.6 g/dL (ref 30.0–36.0)
MCV: 92.3 fL (ref 78.0–100.0)
MONO ABS: 0.7 10*3/uL (ref 0.1–1.0)
MONOS PCT: 10 %
Neutro Abs: 4.9 10*3/uL (ref 1.7–7.7)
Neutrophils Relative %: 66 %
PLATELETS: 207 10*3/uL (ref 150–400)
RBC: 4.18 MIL/uL (ref 3.87–5.11)
RDW: 13.9 % (ref 11.5–15.5)
WBC: 7.5 10*3/uL (ref 4.0–10.5)

## 2017-11-05 LAB — COMPREHENSIVE METABOLIC PANEL
ALBUMIN: 3.5 g/dL (ref 3.5–5.0)
ALK PHOS: 119 U/L (ref 38–126)
ALT: 15 U/L (ref 14–54)
AST: 21 U/L (ref 15–41)
Anion gap: 8 (ref 5–15)
BUN: 10 mg/dL (ref 6–20)
CO2: 28 mmol/L (ref 22–32)
CREATININE: 0.64 mg/dL (ref 0.44–1.00)
Calcium: 9.1 mg/dL (ref 8.9–10.3)
Chloride: 106 mmol/L (ref 101–111)
GFR calc Af Amer: >60 mL/min (ref >60)
GFR calc non Af Amer: >60 mL/min (ref >60)
GLUCOSE: 145 mg/dL — AB (ref 65–99)
Potassium: 3.7 mmol/L (ref 3.5–5.1)
SODIUM: 142 mmol/L (ref 135–145)
Total Bilirubin: 0.6 mg/dL (ref 0.3–1.2)
Total Protein: 6.6 g/dL (ref 6.5–8.1)

## 2017-11-05 LAB — I-STAT TROPONIN, ED: TROPONIN I, POC: 0.01 ng/mL (ref 0.00–0.08)

## 2017-11-05 LAB — D-DIMER, QUANTITATIVE (NOT AT ARMC): D DIMER QUANT: 2.42 ug{FEU}/mL — AB (ref 0.00–0.50)

## 2017-11-05 MED ORDER — IOPAMIDOL (ISOVUE-370) INJECTION 76%
INTRAVENOUS | Status: AC
  Filled 2017-11-05: qty 100

## 2017-11-05 MED ORDER — IOPAMIDOL (ISOVUE-370) INJECTION 76%
100.0000 mL | Freq: Once | INTRAVENOUS | Status: AC | PRN
  Administered 2017-11-05: 100 mL via INTRAVENOUS

## 2017-11-05 MED ORDER — GI COCKTAIL ~~LOC~~
30.0000 mL | Freq: Once | ORAL | Status: AC
  Administered 2017-11-05: 30 mL via ORAL
  Filled 2017-11-05: qty 30

## 2017-11-05 MED ORDER — TIZANIDINE HCL 4 MG PO TABS: 2.0000 mg | ORAL_TABLET | Freq: Three times a day (TID) | ORAL | 0 refills | Status: DC | PRN

## 2017-11-05 NOTE — Telephone Encounter (Signed)
See note

## 2017-11-05 NOTE — Telephone Encounter (Signed)
Attempted to call patient to make sure EMS was on the way but only got her voicemail.  Left a message to let her know I was checking on her.

## 2017-11-05 NOTE — ED Notes (Signed)
Patient transported to CT 

## 2017-11-05 NOTE — ED Notes (Signed)
Pt ambulatory to restroom

## 2017-11-05 NOTE — Telephone Encounter (Signed)
FYI.  Patient has been referred to the ED.

## 2017-11-05 NOTE — Telephone Encounter (Signed)
I returned her call.    She is c/o "I'm not myself".   "I don't know what is wrong but I'm not myself".   "I'm so dizzy and my head is spinning".  "I'm very independent and I'm not myself right now".   Very anxious and breathing fast.   I tried to calm her and reassured her but she kept repeating "I'm not myself".   Her speech was clear but she had a difficult time answering my questions.  She was unable to ambulate without holding onto things. She saw Dr. Jerline Pain yesterday and was started on prednisone and baclofen which she took last night for "a locked muscle in my neck".     See triage notes.    She has decided to call 911 to take her for evaluation.   I asked if she was alone and she told me she lives at [facility] which is an independent living center.   It's not a nursing home when I asked if there was a nurse there that could assess her.  There is not a nurse there.     She was going to 911 when we got off of the phone.     I called her back 1:15PM and she was laying down.   "I don't want to call 911".   "I just don't know what to do".    "I've got this really heavy feeling on my chest".   " It's so heavy".   I instructed her to call 911 right now that it was serious and she needed to get taken to the hospital.    She agreed to do this.     I called her back at 1:20PM to check on her again and make sure she had called 911.   I got her answering machine both times.    I then tried a 3rd time at 1:23PM and got her on the phone.   She was very short of breath and said "I got emergency coming".   I said,  "Good".   "I just wanted to check on you".    I could hear she was very short of breath and her voice sounded a little weaker than prior conversations.   She hung up so that ended our conversation.  I routed a note to Dr. Jerline Pain making him aware.    Reason for Disposition . Caller has URGENT medication question about med that PCP prescribed and triager unable to answer question  Answer  Assessment - Initial Assessment Questions 1. SYMPTOMS: "Do you have any symptoms?"     I'm not right mentally right now.   I'm not feeling well.   I drank some water and I vomited it up.    I'm dizzy and my head is spinning.   "This is not me"   I saw Dr. Jerline Pain yesterday.   I have a locked muscle in my neck.   I started prednisone and baclofen.   I took the pills last night.   I'm very lightheaded.   For my age I'm a very active person.    I'm not myself.   I have to hold on to go to the bathroom.   I'm  in [place].   I'm independent. 2. SEVERITY: If symptoms are present, ask "Are they mild, moderate or severe?"     She has decided to call 911 to be evaluated.    She is repeatedly saying,  "I'm not myself".   "  I'm very independent but I'm not myself right now".    She is breathing fast and anxious sounding over the phone.  "I'm so dizzy I can't get to the bathroom even without holding on to things".    "I don't know what to do".  Protocols used: MEDICATION QUESTION CALL-A-AH

## 2017-11-05 NOTE — ED Provider Notes (Signed)
[facility] EMERGENCY DEPARTMENT Provider Note   CSN: [id number] Arrival date & time: 11/05/17  1432     History   Chief Complaint Chief Complaint  Patient presents with  . Gastroesophageal Reflux    HPI Jeanette Butler is a 85 y.o. female.  The history is provided by the patient. No language interpreter was used.    Jeanette Butler is a 84 y.o. female who presents to the Emergency Department complaining of chest pain. She presents to the emergency department for evaluation of chest pain that began today. On Friday she developed pain in the back of her neck after looking at her iPad for too long. Yesterday she went to her primary care provider's office and was started on prednisone and baclofen for her neck pain. She states that she can move her neck better today. But when she took her morning medicine she began to feel generalized weakness shortly thereafter developed central chest pressure and heaviness. Symptoms began around 9 AM. The chest heaviness is constant nature with no clear alleviating or worsening factors. She denies any fevers, shortness of breath. She did vomit once. No diarrhea, abdominal pain. Prior to ED arrival she received 324 mg aspirin by EMS as well as to sublingual nitro. No prior similar symptoms.  Past Medical History:  Diagnosis Date  . Allergic rhinitis   . Anxiety   . Atrophic vaginitis   . Depression   . GERD (gastroesophageal reflux disease)   . Hypothyroidism   . Migraines   . Recurrent UTI     Patient Active Problem List   Diagnosis Date Noted  . Urinary frequency 10/04/2017  . Insomnia 10/04/2017  . Major depression in remission ([place]) 09/09/2017  . Anxiety 09/09/2017  . Hypothyroidism 09/09/2017  . GERD (gastroesophageal reflux disease) 09/09/2017  . Atrophic vaginitis 09/09/2017  . Migraines 09/09/2017  . Allergic rhinitis 09/09/2017  . HNP (herniated nucleus pulposus), lumbar 08/27/2016  . Herniated nucleus pulposus 08/26/2016      Past Surgical History:  Procedure Laterality Date  . ABDOMINAL HYSTERECTOMY    . BACK SURGERY     2006: L4,L5  . LUMBAR LAMINECTOMY/DECOMPRESSION MICRODISCECTOMY Left 08/27/2016   Procedure: LUMBAR LAMINECTOMY/DECOMPRESSION MICRODISCECTOMY LUMBAR FIVE-SACRAL ONE LEFT;  Surgeon: Jovita Gamma, MD;  Location: [facility];  Service: Neurosurgery;  Laterality: Left;     OB History   None      Home Medications    Prior to Admission medications   Medication Sig Start Date End Date Taking? Authorizing Provider  estradiol (ESTRACE) 0.1 MG/GM vaginal cream Place 1 Applicatorful vaginally 3 (three) times a week. 08/09/16   [provider]  FLUoxetine (PROZAC) 20 MG capsule Take 20 mg by mouth daily. 06/01/16   [provider]  fluticasone (FLONASE) 50 MCG/ACT nasal spray Place into both nostrils daily.    [provider]  levothyroxine (SYNTHROID, LEVOTHROID) 75 MCG tablet Take 75 mcg by mouth daily. 08/09/16   [provider]  mirabegron ER (MYRBETRIQ) 25 MG TB24 tablet Take 1 tablet (25 mg total) by mouth daily. 10/04/17   Vivi Barrack, MD  omeprazole (PRILOSEC) 40 MG capsule Take 40 mg by mouth daily.    [provider]  tiZANidine (ZANAFLEX) 4 MG tablet Take 0.5-1 tablets (2-4 mg total) by mouth every 8 (eight) hours as needed for muscle spasms. 11/05/17   Quintella Reichert, MD  zolpidem (AMBIEN) 10 MG tablet Take 1 tablet (10 mg total) by mouth at bedtime. 10/04/17   Dimas Chyle  M, MD    Family History Family History  Problem Relation Age of Onset  . Skin cancer Neg Hx     Social History Social History   Tobacco Use  . Smoking status: Former Research scientist (life sciences)  . Smokeless tobacco: Former Network engineer Use Topics  . Alcohol use: Yes    Alcohol/week: 4.2 oz    Types: 7 Glasses of wine per week  . Drug use: No     Allergies   Patient has no known allergies.   Review of Systems Review of Systems  All other systems reviewed and are  negative.    Physical Exam Updated Vital Signs BP (!) 138/96 (BP Location: Right Arm)   Pulse 71   Temp 97.6 F (36.4 C) (Oral)   Resp 16   Ht 5\' 6"  (1.676 m)   Wt 68 kg (150 lb)   SpO2 95%   BMI 24.21 kg/m   Physical Exam  Constitutional: She is oriented to person, place, and time. She appears well-developed and well-nourished.  HENT:  Head: Normocephalic and atraumatic.  Cardiovascular: Normal rate and regular rhythm.  No murmur heard. Pulmonary/Chest: Effort normal and breath sounds normal. No respiratory distress.  Abdominal: Soft. There is no tenderness. There is no rebound and no guarding.  Musculoskeletal: She exhibits no edema or tenderness.  Neurological: She is alert and oriented to person, place, and time.  Skin: Skin is warm and dry.  Psychiatric: She has a normal mood and affect. Her behavior is normal.  Nursing note and vitals reviewed.    ED Treatments / Results  Labs (all labs ordered are listed, but only abnormal results are displayed) Labs Reviewed  COMPREHENSIVE METABOLIC PANEL - Abnormal; Notable for the following components:      Result Value   Glucose, Bld 145 (*)    All other components within normal limits  D-DIMER, QUANTITATIVE (NOT AT [facility]) - Abnormal; Notable for the following components:   D-Dimer, Quant 2.42 (*)    All other components within normal limits  CBC WITH DIFFERENTIAL/PLATELET  I-STAT TROPONIN, ED    EKG EKG Interpretation  Date/Time:  Tuesday November 05 2017 14:35:46 EDT Ventricular Rate:  68 PR Interval:    QRS Duration: 108 QT Interval:  447 QTC Calculation: 476 R Axis:   12 Text Interpretation:  Sinus rhythm no prior available for comparison Confirmed by Quintella Reichert [phone]) on 11/05/2017 2:41:54 PM   Radiology Dg Chest 2 View  Result Date: 11/05/2017 CLINICAL DATA:  Mid chest pain, dizziness EXAM: CHEST - 2 VIEW COMPARISON:  None. FINDINGS: No active infiltrate or effusion is seen. The lungs are slightly  hyperaerated. Mediastinal and hilar contours are unremarkable. The heart is mildly enlarged. No acute bony abnormality is seen. IMPRESSION: 1. No active lung disease.  Slight hyper aeration. 2. Mild cardiomegaly. Electronically Signed   By: Ivar Drape M.D.   On: 11/05/2017 17:10   Ct Angio Chest Pe W/cm &/or Wo Cm  Result Date: 11/05/2017 CLINICAL DATA:  Chest pain/pressure.  Positive D-dimer. EXAM: CT ANGIOGRAPHY CHEST WITH CONTRAST TECHNIQUE: Multidetector CT imaging of the chest was performed using the standard protocol during bolus administration of intravenous contrast. Multiplanar CT image reconstructions and MIPs were obtained to evaluate the vascular anatomy. CONTRAST:  124mL ISOVUE-370 IOPAMIDOL (ISOVUE-370) INJECTION 76% COMPARISON:  Chest x-ray 11/05/2017. FINDINGS: Cardiovascular: Mild cardiomegaly. Minimal calcified plaque over the left anterior descending coronary artery. No evidence of pulmonary embolism. Mediastinum/Nodes: No evidence of mediastinal or hilar adenopathy. Remaining mediastinal  structures are unremarkable. Lungs/Pleura: Lungs are well inflated with subtle hazy central perivascular density over the lung bases likely subtle interstitial edema. No evidence of effusion or focal lobar consolidation. Airways are within normal. Mild biapical pleural thickening. Upper Abdomen: No acute abnormality.  Colonic diverticulosis. Musculoskeletal: Mild degenerative change of the spine. Review of the MIP images confirms the above findings. IMPRESSION: No evidence of pulmonary embolism. Subtle hazy central perivascular density likely mild interstitial edema. Mild cardiomegaly. Electronically Signed   By: Marin Olp M.D.   On: 11/05/2017 18:32    Procedures Procedures (including critical care time)  Medications Ordered in ED Medications  iopamidol (ISOVUE-370) 76 % injection (has no administration in time range)  gi cocktail (Maalox,Lidocaine,Donnatal) (30 mLs Oral Given 11/05/17 1540)    iopamidol (ISOVUE-370) 76 % injection 100 mL (100 mLs Intravenous Contrast Given 11/05/17 1758)     Initial Impression / Assessment and Plan / ED Course  I have reviewed the triage vital signs and the nursing notes.  Pertinent labs & imaging results that were available during my care of the patient were reviewed by me and considered in my medical decision making (see chart for details).     Patient here for evaluation of chest pain after starting medications to treat torticollis. Overall her torticollis is improving but she does have some persistent posterior neck pain. In terms of her chest pain, feel like this is secondary to reflux. Her chest pain resolved after G.I. cocktail administration. Given history of estrogen use, D dimer was obtained and was slightly elevated. CTA was negative for acute PE. Current presentation is not consistent with ACS, dissection, pneumonia, acute CHF. Given her adverse reactions to the prednisone and baclofen, will discontinue these meds at this time. Discussed with patient taking Tylenol and ibuprofen for her neck pain and will add tizanidine, low dose, to see if this muscle relaxer gives her any benefit. Discussed close outpatient follow-up as well as return precautions.  Final Clinical Impressions(s) / ED Diagnoses   Final diagnoses:  Gastroesophageal reflux disease without esophagitis  Adverse effect of drug, initial encounter  Torticollis, acute    ED Discharge Orders        Ordered    tiZANidine (ZANAFLEX) 4 MG tablet  Every 8 hours PRN     11/05/17 Grier Mitts, MD 11/05/17 2311

## 2017-11-05 NOTE — Discharge Instructions (Addendum)
You can take tylenol, 650 mg by mouth every six hours as needed for pain.  You can also take ibuprofen, 400 mg by mouth every six to eight hours as needed for pain.

## 2017-11-05 NOTE — ED Notes (Signed)
Discharge instructions and prescriptions discussed with Pt. Pt verbalized understanding. Pt stable and ambulatory.   

## 2017-11-05 NOTE — ED Triage Notes (Signed)
Pt arrives via EMS from [facility] with reports of cental cp after taking baclofen and prednisone. Pt with 1 bout of emesis. No cardiac hx, hx gerd. 12 lead unremarkable. 324mg  asa and 2 SL nitro given en route with no relief ( pt states nitro made it worse). Given 4mg  IV zofran.  147/60, 75 NSR, 98% RA. Pt in NAD upon arrival.

## 2017-11-05 NOTE — ED Notes (Signed)
Patient transported to X-ray 

## 2017-11-05 NOTE — Telephone Encounter (Signed)
After attempting to reach the patient and her emergency contact without success, I can now see that the patient has arrived at the ED.

## 2017-11-20 ENCOUNTER — Telehealth: Payer: Self-pay | Admitting: Family Medicine

## 2017-11-20 NOTE — Telephone Encounter (Signed)
Copied from [place] [id number]. Topic: Quick Communication - Rx Refill/Question >> Nov 20, 2017  2:03 PM Scherrie Gerlach wrote: Medication: FLUoxetine (PROZAC) 20 MG capsule                    omeprazole (PRILOSEC) 40 MG capsule                     levothyroxine (SYNTHROID, LEVOTHROID) 75 MCG tablet Dr Jerline Pain has never filled these scripts for her, and pt needs new Rx sent to  [facility], [place] [address] [phone] (Phone) [phone] (Fax)

## 2017-11-20 NOTE — Telephone Encounter (Signed)
Please advise.  Historical provider. 

## 2017-11-22 ENCOUNTER — Other Ambulatory Visit: Payer: Self-pay

## 2017-11-22 MED ORDER — LEVOTHYROXINE SODIUM 75 MCG PO TABS: 75.0000 ug | ORAL_TABLET | Freq: Every day | ORAL | 1 refills | Status: DC

## 2017-11-22 MED ORDER — FLUOXETINE HCL 20 MG PO CAPS: 20.0000 mg | ORAL_CAPSULE | Freq: Every day | ORAL | 1 refills | Status: DC

## 2017-11-22 MED ORDER — OMEPRAZOLE 40 MG PO CPDR: 40.0000 mg | DELAYED_RELEASE_CAPSULE | Freq: Every day | ORAL | 1 refills | Status: DC

## 2017-11-22 NOTE — Telephone Encounter (Signed)
Rx for all sent to pharmacy.

## 2017-11-22 NOTE — Telephone Encounter (Signed)
Ok with refilling both. Patient will need to have her TSH checked if it has not been done in the last year - I believe we were waiting on her prior records but have not received anything yet.  Algis Greenhouse. Jerline Pain, MD 11/22/2017 7:38 AM

## 2017-11-25 ENCOUNTER — Telehealth: Payer: Self-pay | Admitting: Family Medicine

## 2017-11-25 NOTE — Telephone Encounter (Signed)
Copied from [place] [phone]. Topic: Quick Communication - See Telephone Encounter >> Nov 25, 2017  4:12 PM Percell Belt A wrote: CRM for notification. See Telephone encounter for: 11/25/17.  Pt called in and would like to know why her omeprazole (PRILOSEC) 40 MG capsule [[id number]] was increased to 40mg  instead of 20 mgs?      Best number [phone]

## 2017-11-26 ENCOUNTER — Other Ambulatory Visit: Payer: Self-pay

## 2017-11-26 MED ORDER — OMEPRAZOLE 20 MG PO CPDR: 20.0000 mg | DELAYED_RELEASE_CAPSULE | Freq: Every day | ORAL | 1 refills | Status: DC

## 2017-11-26 NOTE — Telephone Encounter (Signed)
Patient requesting call back, [phone]

## 2017-11-26 NOTE — Telephone Encounter (Signed)
Noted. [place] with her switching providers if that is what she prefers.  Algis Greenhouse. Jerline Pain, MD 11/26/2017 1:40 PM

## 2017-11-26 NOTE — Telephone Encounter (Signed)
Spoke with patient.  She was upset that omeprazole 40 mg was sent to her pharmacy.  I explained to her that 40 mg is what is in her record from a previous provider and 40 mg is what was asked for in the refill request from the other day when she spoke to the [facility].  Patient was still very upset and stated she had never taken 40 mg in her life.  I offered to change her medication list to reflect omeprazole 20 mg and send in a new prescription.  Patient agreeable to this and Rx was sent to pharmacy.  Patient also went on to state she may find another doctor because Dr. Jerline Pain had "sent her to the hospital in an ambulance" previously because he prescribed her the wrong medication.  I advised her that Dr. Jerline Pain would have had no way of knowing she would react negatively to the medication.  She then said she was told she "never should have been prescribed that medication in the first place".  I apologized to the patient and told her I understood how she felt.  I advised that I would pass along the message and that I would be sure to send in her prescription to the pharmacy for her.  Asked her to please let us know if there was anything else we could do for her.  Patient thanked me for my help and the call was ended.

## 2017-11-28 NOTE — Telephone Encounter (Signed)
Routed to incorrect practice.

## 2017-12-05 ENCOUNTER — Telehealth: Payer: Self-pay | Admitting: Family Medicine

## 2017-12-05 NOTE — Telephone Encounter (Signed)
Copied from [place] [id number]. Topic: Complaint - Billing/Coding >> Dec 05, 2017  2:39 PM Percell Belt A wrote: Patient name/MRN/Acct #:  DOS: 10/21/2017 Details of complaint: would like $50.00 wavied.  She stated that she called and canceled this appt??   How would the patient like to see it resolved? Wave late fee On a scale of 1-10, how was your experience?  What would it take to bring it to a 10?   Will automatically be routed to [place] pool.

## 2017-12-20 ENCOUNTER — Telehealth: Payer: Self-pay | Admitting: Family Medicine

## 2017-12-20 NOTE — Telephone Encounter (Signed)
Copied from [place] [id number]. Topic: Quick Communication - Rx Refill/Question >> Dec 20, 2017  4:23 PM Yvette Rack wrote: Medication: levothyroxine (SYNTHROID, LEVOTHROID) 75 MCG tablet  Has the patient contacted their pharmacy? no   Preferred Pharmacy (with phone number or street name): [facility] [place], [address] [phone] (Phone) [phone] (Fax)   Agent: Please be advised that RX refills may take up to 3 business days. We ask that you follow-up with your pharmacy.

## 2017-12-20 NOTE — Telephone Encounter (Signed)
Spoke with staff at [facility] who states that the pt has a current refill of Levothyroxine available for pick up. Called pt and notified that prescription was available for pick up at the pharmacy.

## 2018-01-06 ENCOUNTER — Other Ambulatory Visit: Payer: Self-pay | Admitting: Family Medicine

## 2018-01-06 NOTE — Telephone Encounter (Signed)
Please advise 

## 2018-01-30 DIAGNOSIS — H35363 Drusen (degenerative) of macula, bilateral: Secondary | ICD-10-CM | POA: Diagnosis not present

## 2018-01-30 DIAGNOSIS — Z961 Presence of intraocular lens: Secondary | ICD-10-CM | POA: Diagnosis not present

## 2018-01-30 DIAGNOSIS — H26491 Other secondary cataract, right eye: Secondary | ICD-10-CM | POA: Diagnosis not present

## 2018-03-04 ENCOUNTER — Encounter: Payer: Self-pay | Admitting: Family Medicine

## 2018-03-04 ENCOUNTER — Ambulatory Visit (INDEPENDENT_AMBULATORY_CARE_PROVIDER_SITE_OTHER): Payer: Medicare Other | Admitting: Family Medicine

## 2018-03-04 VITALS — BP 120/72 | HR 78 | Temp 97.6°F | Ht 66.0 in | Wt 150.6 lb

## 2018-03-04 DIAGNOSIS — R300 Dysuria: Secondary | ICD-10-CM | POA: Diagnosis not present

## 2018-03-04 DIAGNOSIS — Z23 Encounter for immunization: Secondary | ICD-10-CM

## 2018-03-04 DIAGNOSIS — M62838 Other muscle spasm: Secondary | ICD-10-CM

## 2018-03-04 LAB — POCT URINALYSIS DIPSTICK
Glucose, UA: NEGATIVE
NITRITE UA: POSITIVE
PH UA: 5.5 (ref 5.0–8.0)
PROTEIN UA: POSITIVE — AB
SPEC GRAV UA: 1.025 (ref 1.010–1.025)
Urobilinogen, UA: 1 E.U./dL

## 2018-03-04 MED ORDER — CEPHALEXIN 500 MG PO CAPS: 500.0000 mg | ORAL_CAPSULE | Freq: Two times a day (BID) | ORAL | 0 refills | Status: DC

## 2018-03-04 MED ORDER — TIZANIDINE HCL 4 MG PO TABS: 2.0000 mg | ORAL_TABLET | Freq: Three times a day (TID) | ORAL | 0 refills | Status: DC | PRN

## 2018-03-04 NOTE — Progress Notes (Signed)
   Subjective:  Jeanette Butler is a 84 y.o. female who presents today for same-day appointment with a chief complaint of dysuria.   HPI:  Dysuria, Acute problem Started yesterday.  Stable over that time.  Feels like prior UTIs.  Has been trying to drink cranberry juice without significant improvement.  No fevers or chills.  No back pain.  No other treatments tried.  No other obvious alleviating or aggravating factors.  Neck pain Worsened within the last 1 to 2 weeks.  Mostly located in left side of her neck.  No reported weakness or numbness.  No bowel or bladder incontinence.  No specific treatments tried.  ROS: Per HPI  PMH: She reports that she has quit smoking. She has quit using smokeless tobacco. She reports that she drinks about 7.0 standard drinks of alcohol per week. She reports that she does not use drugs.  Objective:  Physical Exam: BP 120/72 (BP Location: Left Arm, Patient Position: Sitting, Cuff Size: Normal)   Pulse 78   Temp 97.6 F (36.4 C) (Oral)   Ht 5\' 6"  (1.676 m)   Wt 150 lb 9.6 oz (68.3 kg)   SpO2 96%   BMI 24.31 kg/m   Gen: NAD, resting comfortably CV: RRR with no murmurs appreciated Pulm: NWOB, CTAB with no crackles, wheezes, or rhonchi GI: Normal bowel sounds present. Soft, Nontender, Nondistended. MSK:  -Neck: Tender palpation along left paraspinal muscles. -Back: No CVA tenderness.  No deformities. -Upper extremities: Strength 5 out of 5 throughout.  Sensation light touch intact throughout. -Lower extremities: Strength 5 out of 5 throughout.  Sensation light touch intact throughout. Skin: Warm, dry  Results for orders placed or performed in visit on 03/04/18 (from the past 24 hour(s))  POCT urinalysis dipstick     Status: Abnormal   Collection Time: 03/04/18 10:50 AM  Result Value Ref Range   Color, UA Yellow    Clarity, UA Cloudy    Glucose, UA Negative Negative   Bilirubin, UA Small (1+)    Ketones, UA Trace    Spec Grav, UA 1.025 1.010 - 1.025     Blood, UA Small (1+)    pH, UA 5.5 5.0 - 8.0   Protein, UA Positive (A) Negative   Urobilinogen, UA 1.0 0.2 or 1.0 E.U./dL   Nitrite, UA Positive    Leukocytes, UA Large (3+) (A) Negative   Appearance     Odor      Assessment/Plan:  Dysuria History and UA consistent with UTI.  No signs of systemic illness.  Start Keflex 500 mg twice daily for the next 7 days.  Urine culture pending.  Encouraged good oral hydration.  Discussed reasons to return to care.  Follow-up as needed.  Neck spasm No red flag signs or symptoms.  Start low-dose Zanaflex for the next 1 to 2 weeks.  Discussed reasons to return to care.  Preventative Healthcare Flu vaccine and pneumonia shot given today.  Algis Greenhouse. Jerline Pain, MD 03/04/2018 12:24 PM

## 2018-03-04 NOTE — Patient Instructions (Addendum)
It was nice to see you!  You have a urinary tract infection. Please start the antibiotic.  We will check a urine culture to make sure you do not have a resistant bacteria. We will call you if we need to change your medications.   Please make sure you are drinking plenty of fluids over the next few days.  If your symptoms do not improve over the next 5-7 days, or if they worsen, please let us know. Please also let us know if you have worsening back pain, fevers, chills, or body aches.   We will give you your flu vaccine and pneumonia vaccines today as well.  Please take the zanaflex as needed for your neck pain.  Take care, Dr Jerline Pain

## 2018-03-06 LAB — URINE CULTURE
MICRO NUMBER: 91238701
SPECIMEN QUALITY: ADEQUATE

## 2018-03-07 NOTE — Progress Notes (Signed)
Please inform patient of the following:  Her urine culture confirms UTI. The antibiotic she is on should treat it. Do not need to make any changes. She should let us know if her symptoms do not improve with the antibiotics.  Algis Greenhouse. Jerline Pain, MD 03/07/2018 8:06 AM

## 2018-03-12 ENCOUNTER — Other Ambulatory Visit: Payer: Self-pay | Admitting: Family Medicine

## 2018-03-13 ENCOUNTER — Ambulatory Visit: Payer: Self-pay | Admitting: *Deleted

## 2018-03-13 ENCOUNTER — Telehealth: Payer: Self-pay | Admitting: *Deleted

## 2018-03-13 ENCOUNTER — Telehealth: Payer: Self-pay | Admitting: Family Medicine

## 2018-03-13 ENCOUNTER — Other Ambulatory Visit: Payer: Self-pay

## 2018-03-13 NOTE — Telephone Encounter (Signed)
Rx has been sent to pharmacy

## 2018-03-13 NOTE — Telephone Encounter (Signed)
Copied from [place] [phone]. Topic: General - Other >> Mar 13, 2018 12:02 PM Janace Aris A wrote: Medication: Cephalexin 500 MG  Has the patient contacted their pharmacy? Yes- the pharmacy has sent in a prior request for this medication for pt.   Preferred Pharmacy (with phone number or street name): [facility] [place], [address]  [phone] (Phone) [phone] (Fax)    Agent: Please be advised that RX refills may take up to 3 business days. We ask that you follow-up with your pharmacy.

## 2018-03-13 NOTE — Telephone Encounter (Signed)
See note

## 2018-03-13 NOTE — Telephone Encounter (Signed)
See nurse triage dated 03/13/18 at 1213.

## 2018-03-13 NOTE — Telephone Encounter (Signed)
Attempted to contact pt; left message on voicemail. 

## 2018-03-13 NOTE — Telephone Encounter (Signed)
Spoke with patient after numerous attempts. She was treated for UTI on 03/04/18. She reports the symptoms improved but have returned.  SX: Burning Urgency  Frequency She is requesting a refill on the antibiotic she was prescribed, Cephalexin 500 mg tab  Pharmacy [facility] [phone] [address] and [address].

## 2018-03-13 NOTE — Telephone Encounter (Signed)
Patient called again to get the status of her refill for medication.  Patient stated that she is still have burning and urgency to urinate.  Patient really needs the antibiotic.  Please call patient to let her know that the medication has been refilled.  CB# [phone]

## 2018-03-13 NOTE — Telephone Encounter (Signed)
Attempted to return call to pt. To further discuss symptoms.  Left vm. to return call to speak to a Triage Nurse.

## 2018-03-13 NOTE — Telephone Encounter (Signed)
Rx has been sent to pharmacy and patient has been notified via voicemail.

## 2018-03-13 NOTE — Telephone Encounter (Addendum)
Attempted to contact pt regarding symptoms and the need for prescription of Cephalexin; left message on voicemail [phone]; unable to complete triage at this time; also see CRM [phone].

## 2018-04-05 ENCOUNTER — Other Ambulatory Visit: Payer: Self-pay | Admitting: Family Medicine

## 2018-04-09 NOTE — Telephone Encounter (Signed)
Please advise 

## 2018-04-11 DIAGNOSIS — R05 Cough: Secondary | ICD-10-CM | POA: Diagnosis not present

## 2018-07-11 ENCOUNTER — Telehealth: Payer: Self-pay | Admitting: Family Medicine

## 2018-07-11 NOTE — Telephone Encounter (Signed)
Made in Error

## 2018-07-14 ENCOUNTER — Other Ambulatory Visit: Payer: Self-pay | Admitting: Family Medicine

## 2018-07-15 ENCOUNTER — Telehealth: Payer: Self-pay | Admitting: Radiology

## 2018-07-15 ENCOUNTER — Encounter: Payer: Self-pay | Admitting: Family Medicine

## 2018-07-15 NOTE — Telephone Encounter (Signed)
See Note.   Copied from [place] [id number]. Topic: General - Other >> Jul 15, 2018  3:06 PM Yvette Rack wrote: Reason for CRM: Siva with [facility] stated they did not receive the Rx for zolpidem (AMBIEN) 10 MG tablet. Siva requests a call back as patient is at the pharmacy. Cb# [phone]

## 2018-07-15 NOTE — Telephone Encounter (Signed)
Please advise 

## 2018-07-15 NOTE — Telephone Encounter (Signed)
We had not yet sent this refill to the pharmacy, the patient took it upon herself to go to the pharmacy to pick up the prescription.  The refill has been sent and the pharmacy is filling the prescription.

## 2018-07-15 NOTE — Telephone Encounter (Signed)
This encounter was created in error - please disregard.

## 2018-08-20 ENCOUNTER — Other Ambulatory Visit: Payer: Self-pay | Admitting: Family Medicine

## 2018-08-22 NOTE — Telephone Encounter (Signed)
Rx filled. Please schedule 6 month f/u appointment.  Algis Greenhouse. Jerline Pain, MD 08/22/2018 2:41 PM

## 2018-08-22 NOTE — Telephone Encounter (Signed)
Last OV 03/04/2018 Last refill Prozac 11/22/2017 #90/1                 Ambien 07/15/2018 #90/0 Next OV not scheduled

## 2018-08-23 ENCOUNTER — Other Ambulatory Visit: Payer: Self-pay | Admitting: Family Medicine

## 2018-08-23 MED ORDER — LEVOTHYROXINE SODIUM 75 MCG PO TABS: 75.0000 ug | ORAL_TABLET | Freq: Every day | ORAL | 1 refills | Status: DC

## 2018-12-18 ENCOUNTER — Other Ambulatory Visit: Payer: Self-pay

## 2019-01-07 ENCOUNTER — Telehealth: Payer: Self-pay | Admitting: Family Medicine

## 2019-01-09 NOTE — Telephone Encounter (Signed)
Pt called in to followup on refill request. Assisted pt with scheduling fu for medication.   Pt says that she will be out over the weekend. Pt would like further assistance.

## 2019-01-12 NOTE — Telephone Encounter (Signed)
See request °

## 2019-01-12 NOTE — Telephone Encounter (Signed)
Patient called back to cancel appointment and reschedule. Patient is very adamant about being seen in person but does not want any of the times that are available to be seen in person. Patient requesting call back from CMA to discuss further.

## 2019-01-14 ENCOUNTER — Ambulatory Visit: Payer: Medicare Other | Admitting: Family Medicine

## 2019-01-14 NOTE — Telephone Encounter (Signed)
Pt states she ran out several days ago of zolpidem (AMBIEN) 10 MG tablet  Pt states she really needs because she is NOT sleeping.  Pt has virtual Thurs 10:40 but hopes this Rx can be sent today.  [place], [place] [address] [phone] (Phone) [phone]

## 2019-01-15 ENCOUNTER — Telehealth (INDEPENDENT_AMBULATORY_CARE_PROVIDER_SITE_OTHER): Payer: Medicare Other | Admitting: Family Medicine

## 2019-01-15 DIAGNOSIS — G4700 Insomnia, unspecified: Secondary | ICD-10-CM

## 2019-01-15 MED ORDER — ZOLPIDEM TARTRATE 10 MG PO TABS: 10.0000 mg | ORAL_TABLET | Freq: Every day | ORAL | 1 refills | Status: DC

## 2019-01-15 NOTE — Telephone Encounter (Signed)
Rx filled

## 2019-01-15 NOTE — Progress Notes (Signed)
   Chief Complaint:  Allyshia Bibeault is a 85 y.o. female who presents for a telephone visit with a chief complaint of insomnia.   Assessment/Plan:  Insomnia Stable.  Ambien refilled.  Follow-up in 6 months.    Subjective:  HPI:  She is currently taking Ambien 10 mg nightly as needed for insomnia.  She is doing well with this.  She has been on this for several years and has never had any side effects.  She requests referral for this today.  She is doing well with her other medications as well.  She is continuing Prozac 20 mg daily, Synthroid 75 mcg daily and Myrbetriq 25 mg daily.  She has not had any side effects either of these medications as well.  ROS: Per HPI  PMH: She reports that she has quit smoking. She has quit using smokeless tobacco. She reports current alcohol use of about 7.0 standard drinks of alcohol per week. She reports that she does not use drugs.      Objective/Observations   No results found for this or any previous visit (from the past 72 hour(s)).   Telephone Visit   I connected with Carlisle Cater on 01/15/19 at 10:40 AM EDT via telephone and verified that I am speaking with the correct person using two identifiers. I discussed the limitations of evaluation and management by telemedicine and the availability of in person appointments. The patient expressed understanding and agreed to proceed.   Patient location: Home Provider location: [place] participating in the virtual visit: Myself and PAtient  A total of 6 minutes were spent on medical discussion.      Algis Greenhouse. Jerline Pain, MD 01/15/2019 11:33 AM

## 2019-01-15 NOTE — Assessment & Plan Note (Signed)
Stable.  Ambien refilled.  Follow-up in 6 months.

## 2019-02-06 ENCOUNTER — Other Ambulatory Visit: Payer: Self-pay | Admitting: Family Medicine

## 2019-02-06 NOTE — Telephone Encounter (Signed)
Rx Request 

## 2019-03-19 DIAGNOSIS — Z23 Encounter for immunization: Secondary | ICD-10-CM | POA: Diagnosis not present

## 2019-03-24 ENCOUNTER — Other Ambulatory Visit: Payer: Self-pay | Admitting: Family Medicine

## 2019-03-24 MED ORDER — FLUOXETINE HCL 20 MG PO CAPS: 20.0000 mg | ORAL_CAPSULE | Freq: Every day | ORAL | 1 refills | Status: DC

## 2019-03-24 NOTE — Telephone Encounter (Signed)
Medication Refill - Medication: FLUoxetine (PROZAC) 20 MG capsule  Has the patient contacted their pharmacy? Yes.   (Agent: If no, request that the patient contact the pharmacy for the refill.) (Agent: If yes, when and what did the pharmacy advise?)  Preferred Pharmacy (with phone number or street name): [facility] [place], [place] - [address] AT [address]: Please be advised that RX refills may take up to 3 business days. We ask that you follow-up with your pharmacy.

## 2019-03-25 DIAGNOSIS — Z1159 Encounter for screening for other viral diseases: Secondary | ICD-10-CM | POA: Diagnosis not present

## 2019-04-01 ENCOUNTER — Telehealth: Payer: Self-pay | Admitting: Family Medicine

## 2019-04-01 NOTE — Telephone Encounter (Signed)
I called the patient to schedule AWV, but she declined. She prefers to just see the doctor.

## 2019-04-14 ENCOUNTER — Telehealth: Payer: Self-pay | Admitting: Family Medicine

## 2019-04-14 NOTE — Telephone Encounter (Signed)
Pt received call from [facility] campaign to schedule AWV. She wanted to come in for physical. Scheduled pt for both.

## 2019-04-15 ENCOUNTER — Other Ambulatory Visit: Payer: Self-pay

## 2019-04-22 ENCOUNTER — Other Ambulatory Visit: Payer: Self-pay

## 2019-04-23 ENCOUNTER — Ambulatory Visit: Payer: Medicare Other

## 2019-04-23 ENCOUNTER — Encounter: Payer: Medicare Other | Admitting: Family Medicine

## 2019-06-13 DIAGNOSIS — Z20828 Contact with and (suspected) exposure to other viral communicable diseases: Secondary | ICD-10-CM | POA: Diagnosis not present

## 2019-06-13 DIAGNOSIS — Z03818 Encounter for observation for suspected exposure to other biological agents ruled out: Secondary | ICD-10-CM | POA: Diagnosis not present

## 2019-06-19 DIAGNOSIS — Z20828 Contact with and (suspected) exposure to other viral communicable diseases: Secondary | ICD-10-CM | POA: Diagnosis not present

## 2019-06-19 DIAGNOSIS — Z03818 Encounter for observation for suspected exposure to other biological agents ruled out: Secondary | ICD-10-CM | POA: Diagnosis not present

## 2019-07-14 ENCOUNTER — Other Ambulatory Visit: Payer: Self-pay

## 2019-07-14 MED ORDER — ZOLPIDEM TARTRATE 10 MG PO TABS: 10.0000 mg | ORAL_TABLET | Freq: Every day | ORAL | 0 refills | Status: DC

## 2019-07-17 ENCOUNTER — Telehealth (INDEPENDENT_AMBULATORY_CARE_PROVIDER_SITE_OTHER): Payer: Medicare Other | Admitting: Family Medicine

## 2019-07-17 DIAGNOSIS — G4700 Insomnia, unspecified: Secondary | ICD-10-CM

## 2019-07-17 DIAGNOSIS — E039 Hypothyroidism, unspecified: Secondary | ICD-10-CM

## 2019-07-17 DIAGNOSIS — K219 Gastro-esophageal reflux disease without esophagitis: Secondary | ICD-10-CM | POA: Diagnosis not present

## 2019-07-17 MED ORDER — LEVOTHYROXINE SODIUM 75 MCG PO TABS: 75.0000 ug | ORAL_TABLET | Freq: Every day | ORAL | 3 refills | Status: AC

## 2019-07-17 MED ORDER — OMEPRAZOLE 20 MG PO CPDR: 20.0000 mg | DELAYED_RELEASE_CAPSULE | Freq: Every day | ORAL | 3 refills | Status: DC

## 2019-07-17 MED ORDER — FLUOXETINE HCL 20 MG PO CAPS: 20.0000 mg | ORAL_CAPSULE | Freq: Every day | ORAL | 3 refills | Status: DC

## 2019-07-17 NOTE — Progress Notes (Signed)
   Jeanette Butler is a 86 y.o. female who presents today for a virtual office visit.  Assessment/Plan:  Chronic Problems Addressed Today: Insomnia Stable.  Continue Ambien.  GERD (gastroesophageal reflux disease) Stable.  Refill Prilosec 20 mg daily.  Hypothyroidism Stable.  Continue Synthroid 75 mcg daily.     Subjective:  HPI:  See A/P.         Objective/Observations  Physical Exam: Gen: NAD, resting comfortably Pulm: Normal work of breathing Neuro: Grossly normal, moves all extremities Psych: Normal affect and thought content  Virtual Visit via Video   I connected with Jeanette Butler on 07/17/19 at 11:20 AM EST by a video enabled telemedicine application and verified that I am speaking with the correct person using two identifiers. The limitations of evaluation and management by telemedicine and the availability of in person appointments were discussed. The patient expressed understanding and agreed to proceed.   Patient location: Home Provider location: [place] participating in the virtual visit: Myself and Patient     Algis Greenhouse. Jerline Pain, MD 07/17/2019 11:52 AM

## 2019-07-17 NOTE — Assessment & Plan Note (Signed)
Stable.  Refill Prilosec 20 mg daily.

## 2019-07-17 NOTE — Assessment & Plan Note (Signed)
Stable.  Continue Synthroid 75 mcg daily.

## 2019-07-17 NOTE — Assessment & Plan Note (Signed)
Stable.  Continue Ambien. 

## 2019-10-07 ENCOUNTER — Other Ambulatory Visit: Payer: Self-pay | Admitting: Family Medicine

## 2019-10-07 NOTE — Telephone Encounter (Signed)
Pt requesting Refills Last OV 03/04/2018

## 2019-10-27 DIAGNOSIS — M545 Low back pain: Secondary | ICD-10-CM | POA: Diagnosis not present

## 2020-02-12 DIAGNOSIS — Z23 Encounter for immunization: Secondary | ICD-10-CM | POA: Diagnosis not present

## 2020-03-28 ENCOUNTER — Other Ambulatory Visit: Payer: Self-pay | Admitting: Family Medicine

## 2020-03-28 NOTE — Telephone Encounter (Signed)
LAST APPOINTMENT DATE: 03/04/2018  NEXT APPOINTMENT DATE: Visit date not found   Rx Ambien  LAST REFILL: 10/08/2019  QTY:

## 2020-05-03 ENCOUNTER — Other Ambulatory Visit: Payer: Self-pay | Admitting: Family Medicine

## 2020-05-03 NOTE — Telephone Encounter (Signed)
  LAST APPOINTMENT DATE: 03/28/2020   NEXT APPOINTMENT DATE:@Visit  date not found  MEDICATION: FLUoxetine (PROZAC) 20 MG capsule  PHARMACY:[id number] - [place], [place] - [address] AT [place] [address]  COMMENTS: Patient is requesting an increase in the medicine Please advise

## 2020-05-26 DIAGNOSIS — E039 Hypothyroidism, unspecified: Secondary | ICD-10-CM | POA: Diagnosis not present

## 2020-05-26 DIAGNOSIS — R4589 Other symptoms and signs involving emotional state: Secondary | ICD-10-CM | POA: Diagnosis not present

## 2020-05-26 DIAGNOSIS — Z0000 Encounter for general adult medical examination without abnormal findings: Secondary | ICD-10-CM | POA: Diagnosis not present

## 2020-05-26 DIAGNOSIS — Z8701 Personal history of pneumonia (recurrent): Secondary | ICD-10-CM | POA: Diagnosis not present

## 2020-05-26 DIAGNOSIS — R0989 Other specified symptoms and signs involving the circulatory and respiratory systems: Secondary | ICD-10-CM | POA: Diagnosis not present

## 2020-08-10 ENCOUNTER — Other Ambulatory Visit: Payer: Self-pay | Admitting: Family Medicine

## 2021-03-05 ENCOUNTER — Emergency Department (HOSPITAL_BASED_OUTPATIENT_CLINIC_OR_DEPARTMENT_OTHER): Payer: Medicare Other

## 2021-03-05 ENCOUNTER — Inpatient Hospital Stay (HOSPITAL_BASED_OUTPATIENT_CLINIC_OR_DEPARTMENT_OTHER)
Admission: EM | Admit: 2021-03-05 | Discharge: 2021-03-09 | DRG: 179 | Disposition: A | Discharge status: Home / Self Care | Payer: Medicare Other | Attending: Internal Medicine | Admitting: Internal Medicine

## 2021-03-05 ENCOUNTER — Encounter (HOSPITAL_BASED_OUTPATIENT_CLINIC_OR_DEPARTMENT_OTHER): Payer: Self-pay | Admitting: Obstetrics and Gynecology

## 2021-03-05 ENCOUNTER — Other Ambulatory Visit: Payer: Self-pay

## 2021-03-05 DIAGNOSIS — E039 Hypothyroidism, unspecified: Secondary | ICD-10-CM | POA: Diagnosis not present

## 2021-03-05 DIAGNOSIS — U071 COVID-19: Principal | ICD-10-CM | POA: Diagnosis present

## 2021-03-05 DIAGNOSIS — R519 Headache, unspecified: Secondary | ICD-10-CM | POA: Diagnosis present

## 2021-03-05 DIAGNOSIS — R0689 Other abnormalities of breathing: Secondary | ICD-10-CM | POA: Diagnosis not present

## 2021-03-05 DIAGNOSIS — K219 Gastro-esophageal reflux disease without esophagitis: Secondary | ICD-10-CM | POA: Diagnosis present

## 2021-03-05 DIAGNOSIS — F419 Anxiety disorder, unspecified: Secondary | ICD-10-CM | POA: Diagnosis present

## 2021-03-05 DIAGNOSIS — G4489 Other headache syndrome: Secondary | ICD-10-CM | POA: Diagnosis not present

## 2021-03-05 DIAGNOSIS — Z87440 Personal history of urinary (tract) infections: Secondary | ICD-10-CM | POA: Diagnosis not present

## 2021-03-05 DIAGNOSIS — R112 Nausea with vomiting, unspecified: Secondary | ICD-10-CM | POA: Diagnosis not present

## 2021-03-05 DIAGNOSIS — Z79899 Other long term (current) drug therapy: Secondary | ICD-10-CM | POA: Diagnosis not present

## 2021-03-05 DIAGNOSIS — R051 Acute cough: Secondary | ICD-10-CM | POA: Diagnosis not present

## 2021-03-05 DIAGNOSIS — Z90710 Acquired absence of both cervix and uterus: Secondary | ICD-10-CM | POA: Diagnosis not present

## 2021-03-05 DIAGNOSIS — Z87891 Personal history of nicotine dependence: Secondary | ICD-10-CM

## 2021-03-05 DIAGNOSIS — R059 Cough, unspecified: Secondary | ICD-10-CM | POA: Diagnosis not present

## 2021-03-05 DIAGNOSIS — G43909 Migraine, unspecified, not intractable, without status migrainosus: Secondary | ICD-10-CM | POA: Diagnosis present

## 2021-03-05 DIAGNOSIS — Z8616 Personal history of COVID-19: Secondary | ICD-10-CM

## 2021-03-05 DIAGNOSIS — R110 Nausea: Secondary | ICD-10-CM | POA: Diagnosis not present

## 2021-03-05 DIAGNOSIS — Z79890 Hormone replacement therapy (postmenopausal): Secondary | ICD-10-CM | POA: Diagnosis not present

## 2021-03-05 DIAGNOSIS — F325 Major depressive disorder, single episode, in full remission: Secondary | ICD-10-CM | POA: Diagnosis present

## 2021-03-05 DIAGNOSIS — I10 Essential (primary) hypertension: Secondary | ICD-10-CM | POA: Diagnosis not present

## 2021-03-05 DIAGNOSIS — E876 Hypokalemia: Secondary | ICD-10-CM | POA: Diagnosis present

## 2021-03-05 DIAGNOSIS — R000 Tachycardia, unspecified: Secondary | ICD-10-CM | POA: Diagnosis not present

## 2021-03-05 DIAGNOSIS — J309 Allergic rhinitis, unspecified: Secondary | ICD-10-CM | POA: Diagnosis present

## 2021-03-05 DIAGNOSIS — Z885 Allergy status to narcotic agent status: Secondary | ICD-10-CM | POA: Diagnosis not present

## 2021-03-05 LAB — COMPREHENSIVE METABOLIC PANEL
ALT: 11 U/L (ref 0–44)
AST: 17 U/L (ref 15–41)
Albumin: 4 g/dL (ref 3.5–5.0)
Alkaline Phosphatase: 122 U/L (ref 38–126)
Anion gap: 14 (ref 5–15)
BUN: 9 mg/dL (ref 8–23)
CO2: 20 mmol/L — ABNORMAL LOW (ref 22–32)
Calcium: 9 mg/dL (ref 8.9–10.3)
Chloride: 101 mmol/L (ref 98–111)
Creatinine, Ser: 0.57 mg/dL (ref 0.44–1.00)
GFR, Estimated: >60 mL/min (ref >60)
Glucose, Bld: 141 mg/dL — ABNORMAL HIGH (ref 70–99)
Potassium: 3.3 mmol/L — ABNORMAL LOW (ref 3.5–5.1)
Sodium: 135 mmol/L (ref 135–145)
Total Bilirubin: 0.5 mg/dL (ref 0.3–1.2)
Total Protein: 6.8 g/dL (ref 6.5–8.1)

## 2021-03-05 LAB — URINALYSIS, ROUTINE W REFLEX MICROSCOPIC
Bilirubin Urine: NEGATIVE
Glucose, UA: NEGATIVE mg/dL
Hgb urine dipstick: NEGATIVE
Ketones, ur: 15 mg/dL — AB
Leukocytes,Ua: NEGATIVE
Nitrite: NEGATIVE
Protein, ur: NEGATIVE mg/dL
Specific Gravity, Urine: 1.012 (ref 1.005–1.030)
pH: 8.5 — ABNORMAL HIGH (ref 5.0–8.0)

## 2021-03-05 LAB — MAGNESIUM: Magnesium: 1.7 mg/dL (ref 1.7–2.4)

## 2021-03-05 LAB — CBC
HCT: 39.9 % (ref 36.0–46.0)
Hemoglobin: 13.4 g/dL (ref 12.0–15.0)
MCH: 29.6 pg (ref 26.0–34.0)
MCHC: 33.6 g/dL (ref 30.0–36.0)
MCV: 88.3 fL (ref 80.0–100.0)
Platelets: 213 10*3/uL (ref 150–400)
RBC: 4.52 MIL/uL (ref 3.87–5.11)
RDW: 13.6 % (ref 11.5–15.5)
WBC: 7.7 10*3/uL (ref 4.0–10.5)
nRBC: 0 % (ref 0.0–0.2)

## 2021-03-05 LAB — TROPONIN I (HIGH SENSITIVITY)
Troponin I (High Sensitivity): 4 ng/L (ref <18)
Troponin I (High Sensitivity): 5 ng/L (ref <18)

## 2021-03-05 LAB — FERRITIN: Ferritin: 50 ng/mL (ref 11–307)

## 2021-03-05 LAB — FIBRINOGEN: Fibrinogen: 491 mg/dL — ABNORMAL HIGH (ref 210–475)

## 2021-03-05 LAB — PROCALCITONIN: Procalcitonin: <0.1 ng/mL

## 2021-03-05 LAB — RESP PANEL BY RT-PCR (FLU A&B, COVID) ARPGX2
Influenza A by PCR: NEGATIVE
Influenza B by PCR: NEGATIVE
SARS Coronavirus 2 by RT PCR: POSITIVE — AB

## 2021-03-05 LAB — C-REACTIVE PROTEIN: CRP: 8.9 mg/dL — ABNORMAL HIGH (ref <1.0)

## 2021-03-05 LAB — LIPASE, BLOOD: Lipase: 26 U/L (ref 11–51)

## 2021-03-05 LAB — LACTATE DEHYDROGENASE: LDH: 137 U/L (ref 98–192)

## 2021-03-05 LAB — TRIGLYCERIDES: Triglycerides: 32 mg/dL (ref <150)

## 2021-03-05 LAB — LACTIC ACID, PLASMA: Lactic Acid, Venous: 0.9 mmol/L (ref 0.5–1.9)

## 2021-03-05 LAB — D-DIMER, QUANTITATIVE: D-Dimer, Quant: 1.84 ug/mL-FEU — ABNORMAL HIGH (ref 0.00–0.50)

## 2021-03-05 MED ORDER — MAGNESIUM SULFATE 2 GM/50ML IV SOLN
2.0000 g | Freq: Once | INTRAVENOUS | Status: AC
  Administered 2021-03-05: 2 g via INTRAVENOUS
  Filled 2021-03-05 (×2): qty 50

## 2021-03-05 MED ORDER — POTASSIUM CHLORIDE CRYS ER 20 MEQ PO TBCR
40.0000 meq | EXTENDED_RELEASE_TABLET | Freq: Once | ORAL | Status: AC
  Administered 2021-03-05: 40 meq via ORAL
  Filled 2021-03-05: qty 2

## 2021-03-05 MED ORDER — BUTALBITAL-APAP-CAFFEINE 50-325-40 MG PO TABS
1.0000 | ORAL_TABLET | Freq: Once | ORAL | Status: AC
  Administered 2021-03-05: 1 via ORAL
  Filled 2021-03-05: qty 1

## 2021-03-05 MED ORDER — LEVOTHYROXINE SODIUM 50 MCG PO TABS
75.0000 ug | ORAL_TABLET | Freq: Every day | ORAL | Status: DC
  Administered 2021-03-06 – 2021-03-09 (×4): 75 ug via ORAL
  Filled 2021-03-05 (×4): qty 1

## 2021-03-05 MED ORDER — FLUOXETINE HCL 20 MG PO CAPS
20.0000 mg | ORAL_CAPSULE | Freq: Every day | ORAL | Status: DC
  Administered 2021-03-06 – 2021-03-09 (×4): 20 mg via ORAL
  Filled 2021-03-05 (×4): qty 1

## 2021-03-05 MED ORDER — ZOLPIDEM TARTRATE 5 MG PO TABS
5.0000 mg | ORAL_TABLET | Freq: Every day | ORAL | Status: DC
  Administered 2021-03-05 – 2021-03-08 (×4): 5 mg via ORAL
  Filled 2021-03-05 (×4): qty 1

## 2021-03-05 MED ORDER — SODIUM CHLORIDE 0.9 % IV SOLN
100.0000 mg | Freq: Every day | INTRAVENOUS | Status: DC
  Administered 2021-03-05: 100 mg via INTRAVENOUS
  Filled 2021-03-05: qty 20

## 2021-03-05 MED ORDER — SODIUM CHLORIDE 0.9 % IV BOLUS
1000.0000 mL | Freq: Once | INTRAVENOUS | Status: AC
  Administered 2021-03-05: 1000 mL via INTRAVENOUS

## 2021-03-05 MED ORDER — KETOROLAC TROMETHAMINE 15 MG/ML IJ SOLN
15.0000 mg | Freq: Once | INTRAMUSCULAR | Status: AC
  Administered 2021-03-05: 15 mg via INTRAVENOUS
  Filled 2021-03-05: qty 1

## 2021-03-05 MED ORDER — SODIUM CHLORIDE 0.9 % IV SOLN
100.0000 mg | INTRAVENOUS | Status: AC
  Administered 2021-03-05: 100 mg via INTRAVENOUS

## 2021-03-05 MED ORDER — SUMATRIPTAN SUCCINATE 6 MG/0.5ML ~~LOC~~ SOLN
6.0000 mg | Freq: Once | SUBCUTANEOUS | Status: AC
  Administered 2021-03-05: 6 mg via SUBCUTANEOUS

## 2021-03-05 MED ORDER — SUMATRIPTAN SUCCINATE 50 MG PO TABS: 50.0000 mg | ORAL_TABLET | ORAL | Status: DC | PRN

## 2021-03-05 MED ORDER — DIPHENHYDRAMINE HCL 50 MG/ML IJ SOLN
12.5000 mg | Freq: Once | INTRAMUSCULAR | Status: AC
  Administered 2021-03-05: 12.5 mg via INTRAVENOUS
  Filled 2021-03-05: qty 1

## 2021-03-05 MED ORDER — METHYLPREDNISOLONE SODIUM SUCC 125 MG IJ SOLR
60.0000 mg | Freq: Two times a day (BID) | INTRAMUSCULAR | Status: DC
  Administered 2021-03-05: 60 mg via INTRAVENOUS
  Filled 2021-03-05 (×2): qty 2

## 2021-03-05 MED ORDER — LACTATED RINGERS IV SOLN: INTRAVENOUS | Status: AC

## 2021-03-05 MED ORDER — ONDANSETRON HCL 4 MG/2ML IJ SOLN
4.0000 mg | Freq: Once | INTRAMUSCULAR | Status: AC | PRN
  Administered 2021-03-05: 4 mg via INTRAVENOUS
  Filled 2021-03-05: qty 2

## 2021-03-05 MED ORDER — FENTANYL CITRATE PF 50 MCG/ML IJ SOSY
50.0000 ug | PREFILLED_SYRINGE | Freq: Once | INTRAMUSCULAR | Status: AC
  Administered 2021-03-05: 50 ug via INTRAVENOUS
  Filled 2021-03-05: qty 1

## 2021-03-05 MED ORDER — SODIUM CHLORIDE 0.9 % IV SOLN
100.0000 mg | Freq: Every day | INTRAVENOUS | Status: AC
  Administered 2021-03-06 – 2021-03-07 (×2): 100 mg via INTRAVENOUS
  Filled 2021-03-05 (×2): qty 20

## 2021-03-05 MED ORDER — SODIUM CHLORIDE 0.9 % IV SOLN
100.0000 mg | INTRAVENOUS | Status: DC
Start: 2021-03-05 — End: 2021-03-05
  Filled 2021-03-05: qty 20

## 2021-03-05 MED ORDER — BUTALBITAL-APAP-CAFFEINE 50-325-40 MG PO TABS: 1.0000 | ORAL_TABLET | Freq: Once | ORAL | Status: DC

## 2021-03-05 MED ORDER — ASPIRIN EC 325 MG PO TBEC: 325.0000 mg | DELAYED_RELEASE_TABLET | Freq: Once | ORAL | Status: DC

## 2021-03-05 MED ORDER — ACETAMINOPHEN 325 MG PO TABS
650.0000 mg | ORAL_TABLET | Freq: Once | ORAL | Status: AC
  Administered 2021-03-05: 650 mg via ORAL
  Filled 2021-03-05: qty 2

## 2021-03-05 NOTE — ED Notes (Signed)
Pt incontinent of urine, cleaned and dressed in dry gown.

## 2021-03-05 NOTE — ED Notes (Signed)
Attempted report to [place], no answer . Will attempt again.

## 2021-03-05 NOTE — H&P (Signed)
History and Physical    Cristella Stiver VQM:[id number] DOB: Jul 26, 1926 DOA: 03/05/2021  PCP: Vivi Barrack, MD  Patient coming from: Home.  Chief Complaint: Headache nausea vomiting.  HPI: Jeanette Butler is a 87 y.o. female with history of migraine presents to the ER because of persistent headache and nausea vomiting.  Denies any chest pain or shortness of breath.  Patient states she does have a history of migraines and takes Fiorinal prior to the episode of migraines.  This time the tach was more intense she decided to come to the ER.  Headache is mostly on the vertex squeezing and tight.  Denies any visual symptoms.  Denies any weakness of the extremities.  Patient stated attack is typical of a migraine but more intense this time.  Patient has had her flu shot about 2 days ago.  ED Course: In the ER patient was afebrile nonfocal CT head was unremarkable.  COVID test came back positive but chest x-ray was unremarkable patient was not hypoxic.  Patient was given Imitrex following which patient's headache improved.  Given that patient is COVID-positive admitted for further observation.  Patient was started on empirically with remdesivir and 1 dose of Solu-Medrol was given.  Labs are significant for bicarb of 20 potassium of 3.3.  Review of Systems: As per HPI, rest all negative.   Past Medical History:  Diagnosis Date   Allergic rhinitis    Anxiety    Atrophic vaginitis    Depression    GERD (gastroesophageal reflux disease)    Hypothyroidism    Migraines    Recurrent UTI     Past Surgical History:  Procedure Laterality Date   ABDOMINAL HYSTERECTOMY     BACK SURGERY     2006: L4,L5   LUMBAR LAMINECTOMY/DECOMPRESSION MICRODISCECTOMY Left 08/27/2016   Procedure: LUMBAR LAMINECTOMY/DECOMPRESSION MICRODISCECTOMY LUMBAR FIVE-SACRAL ONE LEFT;  Surgeon: Jovita Gamma, MD;  Location: [facility];  Service: Neurosurgery;  Laterality: Left;     reports that she has quit smoking. She has never been  exposed to tobacco smoke. She has quit using smokeless tobacco. She reports current alcohol use of about 7.0 standard drinks per week. She reports that she does not use drugs.  Allergies  Allergen Reactions   Codeine Other (See Comments)    "Knocks her out"    Family History  Problem Relation Age of Onset   Skin cancer Neg Hx     Prior to Admission medications   Medication Sig Start Date End Date Taking? Authorizing Provider  azelastine (ASTELIN) 0.1 % nasal spray Place 1 spray into both nostrils 2 (two) times daily as needed for rhinitis or allergies.   Yes [provider]  butalbital-aspirin-caffeine (FIORINAL) 50-325-40 MG capsule TAKE 1 CAPSULE BY MOUTH EVERY 4 HOURS AS NEEDED FOR HEADACHE Patient taking differently: Take 1 capsule by mouth every 4 (four) hours as needed for headache or migraine. 02/06/19  Yes Vivi Barrack, MD  FLUoxetine (PROZAC) 20 MG capsule TAKE 1 CAPSULE(20 MG) BY MOUTH DAILY Patient taking differently: Take 20 mg by mouth daily. 08/11/20  Yes Vivi Barrack, MD  levothyroxine (SYNTHROID) 75 MCG tablet Take 1 tablet (75 mcg total) by mouth daily. 07/17/19  Yes Vivi Barrack, MD  omeprazole (PRILOSEC) 20 MG capsule Take 1 capsule (20 mg total) by mouth daily. Patient taking differently: Take 20 mg by mouth daily as needed (for reflux symptoms). 07/17/19  Yes Vivi Barrack, MD  zolpidem (AMBIEN) 10 MG tablet TAKE 1 TABLET  BY MOUTH EVERY DAY AT BEDTIME Patient taking differently: Take 10 mg by mouth at bedtime. 05/03/20  Yes Vivi Barrack, MD  estradiol (ESTRACE) 0.1 MG/GM vaginal cream Place 1 Applicatorful vaginally 3 (three) times a week. Patient not taking: Reported on 03/05/2021 08/09/16   [provider]  mirabegron ER (MYRBETRIQ) 25 MG TB24 tablet Take 1 tablet (25 mg total) by mouth daily. Patient not taking: Reported on 03/05/2021 10/04/17   Vivi Barrack, MD    Physical Exam: Constitutional: Moderately built and  nourished. Vitals:   03/05/21 1627 03/05/21 1631 03/05/21 1849 03/05/21 1854  BP:   (!) 157/84   Pulse: 97 94 86   Resp: 16 14 18    Temp:   97.9 F (36.6 C)   TempSrc:   Oral   SpO2: (!) 81% 93% 94%   Weight:    70 kg  Height:    5' 5.5" (1.664 m)   Eyes: Anicteric no pallor. ENMT: No discharge from the ears eyes nose and mouth. Neck: No mass felt.  No neck rigidity. Respiratory: No rhonchi or crepitations. Cardiovascular: S1-S2 heard. Abdomen: Soft nontender bowel sound present. Musculoskeletal: No edema. Skin: No rash. Neurologic: Alert awake oriented to time place and person.  Moves all extremities. Psychiatric: Appears normal.  Normal affect.   Labs on Admission: I have personally reviewed following labs and imaging studies  CBC: Recent Labs  Lab 03/05/21 1219  WBC 7.7  HGB 13.4  HCT 39.9  MCV 88.3  PLT 811   Basic Metabolic Panel: Recent Labs  Lab 03/05/21 1219  NA 135  K 3.3*  CL 101  CO2 20*  GLUCOSE 141*  BUN 9  CREATININE 0.57  CALCIUM 9.0  MG 1.7   GFR: Estimated Creatinine Clearance: 44.6 mL/min (by C-G formula based on SCr of 0.57 mg/dL). Liver Function Tests: Recent Labs  Lab 03/05/21 1219  AST 17  ALT 11  ALKPHOS 122  BILITOT 0.5  PROT 6.8  ALBUMIN 4.0   Recent Labs  Lab 03/05/21 1219  LIPASE 26   No results for input(s): AMMONIA in the last 168 hours. Coagulation Profile: No results for input(s): INR, PROTIME in the last 168 hours. Cardiac Enzymes: No results for input(s): CKTOTAL, CKMB, CKMBINDEX, TROPONINI in the last 168 hours. BNP (last 3 results) No results for input(s): PROBNP in the last 8760 hours. HbA1C: No results for input(s): HGBA1C in the last 72 hours. CBG: No results for input(s): GLUCAP in the last 168 hours. Lipid Profile: No results for input(s): CHOL, HDL, LDLCALC, TRIG, CHOLHDL, LDLDIRECT in the last 72 hours. Thyroid Function Tests: No results for input(s): TSH, T4TOTAL, FREET4, T3FREE, THYROIDAB in  the last 72 hours. Anemia Panel: No results for input(s): VITAMINB12, FOLATE, FERRITIN, TIBC, IRON, RETICCTPCT in the last 72 hours. Urine analysis:    Component Value Date/Time   COLORURINE YELLOW 03/05/2021 [place] 03/05/2021 1219   LABSPEC 1.012 03/05/2021 1219   PHURINE 8.5 (H) 03/05/2021 [place] 03/05/2021 [place] 03/05/2021 1219   BILIRUBINUR NEGATIVE 03/05/2021 1219   BILIRUBINUR Small (1+) 03/04/2018 1050   KETONESUR 15 (A) 03/05/2021 1219   PROTEINUR NEGATIVE 03/05/2021 1219   UROBILINOGEN 1.0 03/04/2018 1050   NITRITE NEGATIVE 03/05/2021 1219   LEUKOCYTESUR NEGATIVE 03/05/2021 1219   Sepsis Labs: @LABRCNTIP (procalcitonin:4,lacticidven:4) ) Recent Results (from the past 240 hour(s))  Resp Panel by RT-PCR (Flu A&B, Covid) Nasopharyngeal Swab     Status: Abnormal  Collection Time: 03/05/21  1:41 PM   Specimen: Nasopharyngeal Swab; Nasopharyngeal(NP) swabs in vial transport medium  Result Value Ref Range Status   SARS Coronavirus 2 by RT PCR POSITIVE (A) NEGATIVE Final    Comment: RESULT CALLED TO, READ BACK BY AND VERIFIED WITH: Etta Quill @ 1941 03/05/2021 PERRY,J. (NOTE) SARS-CoV-2 target nucleic acids are DETECTED.  The SARS-CoV-2 RNA is generally detectable in upper respiratory specimens during the acute phase of infection. Positive results are indicative of the presence of the identified virus, but do not rule out bacterial infection or co-infection with other pathogens not detected by the test. Clinical correlation with patient history and other diagnostic information is necessary to determine patient infection status. The expected result is Negative.  Fact Sheet for Patients: EntrepreneurPulse.com.au  Fact Sheet for Healthcare Providers: IncredibleEmployment.be  This test is not yet approved or cleared by the [place] FDA and  has been authorized for detection  and/or diagnosis of SARS-CoV-2 by FDA under an Emergency Use Authorization (EUA).  This EUA will remain in effect (meaning this tes t can be used) for the duration of  the COVID-19 declaration under Section 564(b)(1) of the Act, 21 U.S.C. section 360bbb-3(b)(1), unless the authorization is terminated or revoked sooner.     Influenza A by PCR NEGATIVE NEGATIVE Final   Influenza B by PCR NEGATIVE NEGATIVE Final    Comment: (NOTE) The Xpert Xpress SARS-CoV-2/FLU/RSV plus assay is intended as an aid in the diagnosis of influenza from Nasopharyngeal swab specimens and should not be used as a sole basis for treatment. Nasal washings and aspirates are unacceptable for Xpert Xpress SARS-CoV-2/FLU/RSV testing.  Fact Sheet for Patients: EntrepreneurPulse.com.au  Fact Sheet for Healthcare Providers: IncredibleEmployment.be  This test is not yet approved or cleared by the [place] FDA and has been authorized for detection and/or diagnosis of SARS-CoV-2 by FDA under an Emergency Use Authorization (EUA). This EUA will remain in effect (meaning this test can be used) for the duration of the COVID-19 declaration under Section 564(b)(1) of the Act, 21 U.S.C. section 360bbb-3(b)(1), unless the authorization is terminated or revoked.  Performed at [facility], [address] [place], [place] [address]      Radiological Exams on Admission: CT Head Wo Contrast  Result Date: 03/05/2021 CLINICAL DATA:  Headache, intracranial hemorrhage suspected EXAM: CT HEAD WITHOUT CONTRAST TECHNIQUE: Contiguous axial images were obtained from the base of the skull through the vertex without intravenous contrast. COMPARISON:  None. FINDINGS: Brain: No evidence of acute infarction, hemorrhage, hydrocephalus, extra-axial collection or mass lesion/mass effect. Mild-moderate low-density changes within the periventricular and subcortical white matter  compatible with chronic microvascular ischemic change. Mild diffuse cerebral volume loss. Vascular: Atherosclerotic calcifications involving the large vessels of the skull base. No unexpected hyperdense vessel. Skull: Normal. Negative for fracture or focal lesion. Sinuses/Orbits: Partial opacification of the right mastoid air cells. Other: None. IMPRESSION: 1. No acute intracranial findings. 2. Chronic microvascular ischemic change and cerebral volume loss. 3. Partial opacification of the right mastoid air cells. Electronically Signed   By: Davina Poke D.O.   On: 03/05/2021 14:27   DG Chest Port 1 View  Result Date: 03/05/2021 CLINICAL DATA:  Cough, headache EXAM: PORTABLE CHEST 1 VIEW COMPARISON:  11/05/2017 FINDINGS: Heart and mediastinal contours are within normal limits. No focal opacities or effusions. No acute bony abnormality. IMPRESSION: No active disease. Electronically Signed   By: Rolm Baptise M.D.   On: 03/05/2021 17:52    EKG: Independently reviewed.  Sinus tachycardia.  Assessment/Plan Principal Problem:   Intractable headache Active Problems:   Hypothyroidism   Migraines   Nausea & vomiting   COVID-19 virus infection    Intractable headache with nausea vomiting -improved with 1 dose of Imitrex.  Patient's headache is again started happening for which patient is requesting Fiorinal.  Patient states that headache is more typical of her migraine at this time.  If does not improve may consider neurology consult.  Patient is presently afebrile no signs of meningitis. COVID test positive appears to be incidental.  Patient has not febrile or hypoxic and chest x-ray does not show any infiltrates.  Patient was empirically started on remdesivir.  Follow inflammatory markers.  Since patient has intractable headache and nausea vomiting with COVID infection closely monitoring for any further worsening and inpatient status.   DVT prophylaxis: SCDs.  Avoiding anticoagulation  anticipation if patient needs any procedure for the headache. Code Status: Full code. Family Communication: Patient's daughter was at the bedside who also helped with history. Disposition Plan: Home. Consults called: None. Admission status: Inpatient.   Rise Patience MD [facility] Pager [phone].  If 7PM-7AM, please contact night-coverage [email or web address] Password [facility]  03/05/2021, 8:53 PM

## 2021-03-05 NOTE — ED Triage Notes (Addendum)
Patient reports to the ER from [facility]. Patient reports she got the flu shot yesterday and last night she started having nausea, headache, and emesis. Patient stated it continued throughout the night and  BP 161/102 HR 110 SPO2 96% on RA  RR 20 Patient is alert and oriented.  Patient reports the last time this happened she was given nitroglycerin

## 2021-03-05 NOTE — ED Provider Notes (Signed)
[facility] EMERGENCY DEPT Provider Note   CSN: [id number] Arrival date & time: 03/05/21  1209     History Chief Complaint  Patient presents with   Nausea   Emesis    Jeanette Butler is a 87 y.o. female.  HPI  87 year old female with a history of allergic rhinitis, anxiety, atrophic vaginitis, depression, GERD, hypothyroidism, migraines, your current UTI, who presents the emergency department today for evaluation of started over the last 24 hours.  States she got her flu shot last night and since then she has felt unwell.  She further reports a headache and chills.  She denies any cough, congestion, diarrhea or urinary complaints. Denies other associated neurologic complaints.  Past Medical History:  Diagnosis Date   Allergic rhinitis    Anxiety    Atrophic vaginitis    Depression    GERD (gastroesophageal reflux disease)    Hypothyroidism    Migraines    Recurrent UTI     Patient Active Problem List   Diagnosis Date Noted   Nausea & vomiting 03/05/2021   COVID-19 virus infection    Urinary frequency 10/04/2017   Insomnia 10/04/2017   Major depression in remission ([place]) 09/09/2017   Anxiety 09/09/2017   Hypothyroidism 09/09/2017   GERD (gastroesophageal reflux disease) 09/09/2017   Atrophic vaginitis 09/09/2017   Migraines 09/09/2017   Allergic rhinitis 09/09/2017   HNP (herniated nucleus pulposus), lumbar 08/27/2016   Herniated nucleus pulposus 08/26/2016    Past Surgical History:  Procedure Laterality Date   ABDOMINAL HYSTERECTOMY     BACK SURGERY     2006: L4,L5   LUMBAR LAMINECTOMY/DECOMPRESSION MICRODISCECTOMY Left 08/27/2016   Procedure: LUMBAR LAMINECTOMY/DECOMPRESSION MICRODISCECTOMY LUMBAR FIVE-SACRAL ONE LEFT;  Surgeon: Jovita Gamma, MD;  Location: [facility];  Service: Neurosurgery;  Laterality: Left;     OB History   No obstetric history on file.     Family History  Problem Relation Age of Onset   Skin cancer Neg Hx     Social  History   Tobacco Use   Smoking status: Former    Passive exposure: Never   Smokeless tobacco: Former  Scientific laboratory technician Use: Never used  Substance Use Topics   Alcohol use: Yes    Alcohol/week: 7.0 standard drinks    Types: 7 Glasses of wine per week   Drug use: No    Home Medications Prior to Admission medications   Medication Sig Start Date End Date Taking? Authorizing Provider  butalbital-aspirin-caffeine [facility]) 50-325-40 MG capsule TAKE 1 CAPSULE BY MOUTH EVERY 4 HOURS AS NEEDED FOR HEADACHE 02/06/19   Vivi Barrack, MD  estradiol (ESTRACE) 0.1 MG/GM vaginal cream Place 1 Applicatorful vaginally 3 (three) times a week. 08/09/16   [provider]  FLUoxetine (PROZAC) 20 MG capsule TAKE 1 CAPSULE(20 MG) BY MOUTH DAILY 08/11/20   Vivi Barrack, MD  fluticasone [facility]) 50 MCG/ACT nasal spray Place into both nostrils daily.    [provider]  levothyroxine (SYNTHROID) 75 MCG tablet Take 1 tablet (75 mcg total) by mouth daily. 07/17/19   Vivi Barrack, MD  mirabegron ER (MYRBETRIQ) 25 MG TB24 tablet Take 1 tablet (25 mg total) by mouth daily. 10/04/17   Vivi Barrack, MD  omeprazole (PRILOSEC) 20 MG capsule Take 1 capsule (20 mg total) by mouth daily. 07/17/19   Vivi Barrack, MD  zolpidem (AMBIEN) 10 MG tablet TAKE 1 TABLET BY MOUTH EVERY DAY AT BEDTIME 05/03/20   Vivi Barrack, MD  Allergies    Patient has no known allergies.  Review of Systems   Review of Systems  Constitutional:  Positive for chills and diaphoresis.  HENT:  Negative for ear pain and sore throat.   Eyes:  Negative for visual disturbance.  Respiratory:  Negative for cough and shortness of breath.   Cardiovascular:  Negative for chest pain.  Gastrointestinal:  Positive for abdominal pain, nausea and vomiting. Negative for constipation and diarrhea.  Genitourinary:  Negative for dysuria and hematuria.  Musculoskeletal:  Negative for back pain and neck pain.  Skin:  Negative  for rash.  Neurological:  Positive for headaches. Negative for dizziness, weakness, light-headedness and numbness.  All other systems reviewed and are negative.  Physical Exam Updated Vital Signs BP (!) 145/98   Pulse 94   Temp 98.3 F (36.8 C) (Oral)   Resp 14   SpO2 93%   Physical Exam Vitals and nursing note reviewed.  Constitutional:      General: She is not in acute distress.    Appearance: She is well-developed.  HENT:     Head: Normocephalic and atraumatic.     Mouth/Throat:     Mouth: Mucous membranes are dry.  Eyes:     Conjunctiva/sclera: Conjunctivae normal.  Cardiovascular:     Rate and Rhythm: Normal rate and regular rhythm.     Heart sounds: Normal heart sounds. No murmur heard. Pulmonary:     Effort: Pulmonary effort is normal. No respiratory distress.     Breath sounds: Normal breath sounds. No wheezing, rhonchi or rales.  Abdominal:     General: Bowel sounds are normal.     Palpations: Abdomen is soft.     Tenderness: There is no abdominal tenderness. There is no guarding or rebound.  Musculoskeletal:     Cervical back: Neck supple.  Skin:    General: Skin is warm and dry.  Neurological:     Mental Status: She is alert.     Comments: Mental Status:  Alert, thought content appropriate, able to give a coherent history. Speech fluent without evidence of aphasia. Able to follow 2 step commands without difficulty.  Cranial Nerves:  II:  pupils equal, round, reactive to light III,IV, VI: ptosis not present, extra-ocular motions intact bilaterally  V,VII: smile symmetric, facial light touch sensation equal VIII: hearing grossly normal to voice  X: uvula elevates symmetrically  XI: bilateral shoulder shrug symmetric and strong XII: midline tongue extension without fassiculations Motor:  Normal tone. 5/5 strength of BUE and BLE major muscle groups including strong and equal grip strength and dorsiflexion/plantar flexion Sensory: light touch normal in all  extremities.     ED Results / Procedures / Treatments   Labs (all labs ordered are listed, but only abnormal results are displayed) Labs Reviewed  RESP PANEL BY RT-PCR (FLU A&B, COVID) ARPGX2 - Abnormal; Notable for the following components:      Result Value   SARS Coronavirus 2 by RT PCR POSITIVE (*)    All other components within normal limits  COMPREHENSIVE METABOLIC PANEL - Abnormal; Notable for the following components:   Potassium 3.3 (*)    CO2 20 (*)    Glucose, Bld 141 (*)    All other components within normal limits  URINALYSIS, ROUTINE W REFLEX MICROSCOPIC - Abnormal; Notable for the following components:   pH 8.5 (*)    Ketones, ur 15 (*)    All other components within normal limits  CULTURE, BLOOD (ROUTINE X 2)  CULTURE,  BLOOD (ROUTINE X 2)  LIPASE, BLOOD  CBC  MAGNESIUM  LACTIC ACID, PLASMA  LACTIC ACID, PLASMA  D-DIMER, QUANTITATIVE  PROCALCITONIN  LACTATE DEHYDROGENASE  FERRITIN  TRIGLYCERIDES  FIBRINOGEN  C-REACTIVE PROTEIN  TROPONIN I (HIGH SENSITIVITY)  TROPONIN I (HIGH SENSITIVITY)    EKG EKG Interpretation  Date/Time:  Sunday March 05 2021 12:15:00 EDT Ventricular Rate:  102 PR Interval:  175 QRS Duration: 94 QT Interval:  384 QTC Calculation: 501 R Axis:   55 Text Interpretation: Sinus tachycardia Prolonged QT interval Confirmed by Trifan, Matthew ([id number]) on 03/05/2021 1:52:08 PM  Radiology CT Head Wo Contrast  Result Date: 03/05/2021 CLINICAL DATA:  Headache, intracranial hemorrhage suspected EXAM: CT HEAD WITHOUT CONTRAST TECHNIQUE: Contiguous axial images were obtained from the base of the skull through the vertex without intravenous contrast. COMPARISON:  None. FINDINGS: Brain: No evidence of acute infarction, hemorrhage, hydrocephalus, extra-axial collection or mass lesion/mass effect. Mild-moderate low-density changes within the periventricular and subcortical white matter compatible with chronic microvascular ischemic change. Mild  diffuse cerebral volume loss. Vascular: Atherosclerotic calcifications involving the large vessels of the skull base. No unexpected hyperdense vessel. Skull: Normal. Negative for fracture or focal lesion. Sinuses/Orbits: Partial opacification of the right mastoid air cells. Other: None. IMPRESSION: 1. No acute intracranial findings. 2. Chronic microvascular ischemic change and cerebral volume loss. 3. Partial opacification of the right mastoid air cells. Electronically Signed   By: Nicholas  Plundo D.O.   On: 03/05/2021 14:27   DG Chest Port 1 View  Result Date: 03/05/2021 CLINICAL DATA:  Cough, headache EXAM: PORTABLE CHEST 1 VIEW COMPARISON:  11/05/2017 FINDINGS: Heart and mediastinal contours are within normal limits. No focal opacities or effusions. No acute bony abnormality. IMPRESSION: No active disease. Electronically Signed   By: Kevin  Dover M.D.   On: 03/05/2021 17:52    Procedures Procedures   Medications Ordered in ED Medications  magnesium sulfate IVPB 2 g 50 mL (has no administration in time range)  methylPREDNISolone sodium succinate (SOLU-MEDROL) 125 mg/2 mL injection 60 mg (has no administration in time range)  remdesivir 100 mg in sodium chloride 0.9 % 100 mL IVPB (has no administration in time range)  remdesivir 100 mg in sodium chloride 0.9 % 100 mL IVPB (has no administration in time range)  ondansetron (ZOFRAN) injection 4 mg (4 mg Intravenous Given 03/05/21 1222)  sodium chloride 0.9 % bolus 1,000 mL (1,000 mLs Intravenous New Bag/Given 03/05/21 1234)  acetaminophen (TYLENOL) tablet 650 mg (650 mg Oral Given 03/05/21 1237)  potassium chloride SA (KLOR-CON) CR tablet 40 mEq (40 mEq Oral Given 03/05/21 1332)  ketorolac (TORADOL) 15 MG/ML injection 15 mg (15 mg Intravenous Given 03/05/21 1443)  diphenhydrAMINE (BENADRYL) injection 12.5 mg (12.5 mg Intravenous Given 03/05/21 1444)  fentaNYL (SUBLIMAZE) injection 50 mcg (50 mcg Intravenous Given 03/05/21 1622)  SUMAtriptan  (IMITREX) injection 6 mg (6 mg Subcutaneous Given 03/05/21 1805)  SUMAtriptan (IMITREX) injection 6 mg (6 mg Subcutaneous Given 03/05/21 1816)    ED Course  I have reviewed the triage vital signs and the nursing notes.  Pertinent labs & imaging results that were available during my care of the patient were reviewed by me and considered in my medical decision making (see chart for details).    MDM Rules/Calculators/A&P                          87  year old female presents the emergency department today for evaluation of headache nausea and vomiting that  started last night.  She received her flu shot yesterday and symptoms started over the last 24 hours.  She has history of migraines.  Reviewed/interpreted labs CBC unremarkable CMP with mild hypokalemia, mildly low bicarb, otherwise reassuring Lipase negative Trope negative Mag normal UA negative COVID-positive  EKG - with sinus tachycardia, prolonged QT  Reviewed/interpreted imaging CXR - neg CT head - 1. No acute intracranial findings. 2. Chronic microvascular ischemic change and cerebral volume loss. 3. Partial opacification of the right mastoid air cells.  Pt became hypoxic after fentanyl. She was placed on 2l o2. Given her generalized weakness, inability to safely ambulate here, persistent nausea and headache will plan for admission. Pt was seen by Dr. Langston Masker who is in agreement with this plan  4:51 PM CONSULT with Dr. Grandville Silos who accepts patient for admission. Recommends mag, steroids and remdesivir. Recommends inflammatory covid markers and procalcitonin.   5:17 PM Patient refused any more lab testing until she and her daughter can discuss these labs with the admitting doctor. I did have a long discussion with the patient and her daughter and provided education about the utility of the tests but they would like to hold off for now.   Final Clinical Impression(s) / ED Diagnoses Final diagnoses:  Cough  COVID  Acute  intractable headache, unspecified headache type    Rx / DC Orders ED Discharge Orders     None        Rodney Booze, PA-C 03/05/21 1844    Wyvonnia Dusky, MD 03/06/21 [phone]

## 2021-03-05 NOTE — ED Notes (Addendum)
CRITICAL VALUE STICKER  CRITICAL VALUE:covid  RECEIVER (on-site recipient of call):  [place] NOTIFIED: 1526 03/05/21  MESSENGER (representative from lab):  MD NOTIFIED: cortni ,pa  TIME OF NOTIFICATION:1526  RESPONSE:

## 2021-03-05 NOTE — ED Notes (Signed)
Pure wick placed.

## 2021-03-05 NOTE — ED Notes (Signed)
Called pt's daughter Kym and left message on the number listed in her chart.

## 2021-03-05 NOTE — Care Plan (Signed)
Was called by ED PA at [facility] ED about patient who is a 87 year old female history of allergic rhinitis, depression, hypothyroidism, recurrent UTIs, migraine headaches presented to the ED with headache, nausea vomiting generalized weakness, decreased ability to take oral intake which started after receiving flu shot.  Per EDP patient resides at assisted living facility and was so weak to the point unable to stand up and an unsafe disposition at this time.  COVID-19 PCR obtained was positive.  Comprehensive metabolic profile with a potassium of 3.3, bicarb of 20, magnesium of 1.7 otherwise was within normal limits.  Cardiac enzymes negative.  CBC unremarkable.  Vitals on presentation patient noted to be tachycardic with heart rate of 105, noted to have some tachypnea with respiratory rate as high as 29, noted to have sats of 93% on room air.  Patient the ED PA received some fentanyl and desatted with sats up to 81%.  Chest x-ray not done.  Asked ED PA to order some inflammatory markers, chest x-ray, place patient on steroids IV Solu-Medrol 60 mg every 12 hours, start IV remdesivir as patient symptomatic and symptoms likely related to COVID-19 infection.  Patient accepted to progressive care floor at [facility].  No charge.

## 2021-03-05 NOTE — Progress Notes (Signed)
Report received from Ellamae Sia at [facility] ED. Airyana Sprunger, Laurel Dimmer, RN

## 2021-03-05 NOTE — ED Notes (Signed)
Pt and her daughter are declining any further iv's and lab draws til they talk to the hospitalist at [facility].pt states she just came for a headache and vomiting and doesn't want any more testing.PA aware.

## 2021-03-05 NOTE — ED Notes (Signed)
Attempted to call report, nurse to call me back 

## 2021-03-05 NOTE — ED Notes (Signed)
Report called to dana dark ,rn [place].

## 2021-03-05 NOTE — ED Notes (Signed)
Pt able to tolerate 4 sips of water and able to swallow 2 potassium pills in applesauce.

## 2021-03-06 DIAGNOSIS — U071 COVID-19: Secondary | ICD-10-CM | POA: Diagnosis not present

## 2021-03-06 DIAGNOSIS — R051 Acute cough: Secondary | ICD-10-CM

## 2021-03-06 DIAGNOSIS — E039 Hypothyroidism, unspecified: Secondary | ICD-10-CM | POA: Diagnosis not present

## 2021-03-06 DIAGNOSIS — R519 Headache, unspecified: Secondary | ICD-10-CM

## 2021-03-06 LAB — COMPREHENSIVE METABOLIC PANEL
ALT: 14 U/L (ref 0–44)
AST: 20 U/L (ref 15–41)
Albumin: 3.4 g/dL — ABNORMAL LOW (ref 3.5–5.0)
Alkaline Phosphatase: 111 U/L (ref 38–126)
Anion gap: 7 (ref 5–15)
BUN: 9 mg/dL (ref 8–23)
CO2: 23 mmol/L (ref 22–32)
Calcium: 8.3 mg/dL — ABNORMAL LOW (ref 8.9–10.3)
Chloride: 105 mmol/L (ref 98–111)
Creatinine, Ser: 0.52 mg/dL (ref 0.44–1.00)
GFR, Estimated: >60 mL/min (ref >60)
Glucose, Bld: 163 mg/dL — ABNORMAL HIGH (ref 70–99)
Potassium: 3.9 mmol/L (ref 3.5–5.1)
Sodium: 135 mmol/L (ref 135–145)
Total Bilirubin: 0.4 mg/dL (ref 0.3–1.2)
Total Protein: 6.7 g/dL (ref 6.5–8.1)

## 2021-03-06 LAB — CBC WITH DIFFERENTIAL/PLATELET
Abs Immature Granulocytes: 0.02 10*3/uL (ref 0.00–0.07)
Basophils Absolute: 0 10*3/uL (ref 0.0–0.1)
Basophils Relative: 0 %
Eosinophils Absolute: 0 10*3/uL (ref 0.0–0.5)
Eosinophils Relative: 0 %
HCT: 39.8 % (ref 36.0–46.0)
Hemoglobin: 13.2 g/dL (ref 12.0–15.0)
Immature Granulocytes: 0 %
Lymphocytes Relative: 10 %
Lymphs Abs: 0.6 10*3/uL — ABNORMAL LOW (ref 0.7–4.0)
MCH: 29.9 pg (ref 26.0–34.0)
MCHC: 33.2 g/dL (ref 30.0–36.0)
MCV: 90.2 fL (ref 80.0–100.0)
Monocytes Absolute: 0.1 10*3/uL (ref 0.1–1.0)
Monocytes Relative: 3 %
Neutro Abs: 4.9 10*3/uL (ref 1.7–7.7)
Neutrophils Relative %: 87 %
Platelets: 199 10*3/uL (ref 150–400)
RBC: 4.41 MIL/uL (ref 3.87–5.11)
RDW: 13.8 % (ref 11.5–15.5)
WBC: 5.6 10*3/uL (ref 4.0–10.5)
nRBC: 0 % (ref 0.0–0.2)

## 2021-03-06 LAB — C-REACTIVE PROTEIN: CRP: 11.6 mg/dL — ABNORMAL HIGH (ref <1.0)

## 2021-03-06 LAB — D-DIMER, QUANTITATIVE: D-Dimer, Quant: 1.83 ug/mL-FEU — ABNORMAL HIGH (ref 0.00–0.50)

## 2021-03-06 MED ORDER — GUAIFENESIN-DM 100-10 MG/5ML PO SYRP
5.0000 mL | ORAL_SOLUTION | ORAL | Status: DC | PRN
  Administered 2021-03-06: 5 mL via ORAL
  Filled 2021-03-06: qty 10

## 2021-03-06 MED ORDER — BUTALBITAL-APAP-CAFFEINE 50-325-40 MG PO TABS
1.0000 | ORAL_TABLET | Freq: Four times a day (QID) | ORAL | Status: DC | PRN
  Administered 2021-03-06 – 2021-03-07 (×2): 2 via ORAL
  Filled 2021-03-06 (×2): qty 2

## 2021-03-06 NOTE — Plan of Care (Signed)
  Problem: Education: Goal: Knowledge of risk factors and measures for prevention of condition will improve Outcome: Progressing   Problem: Coping: Goal: Psychosocial and spiritual needs will be supported Outcome: Progressing   Problem: Respiratory: Goal: Will maintain a patent airway Outcome: Progressing Goal: Complications related to the disease process, condition or treatment will be avoided or minimized Outcome: Progressing   

## 2021-03-06 NOTE — Evaluation (Signed)
Physical Therapy One Time Evaluation Patient Details Name: Jeanette Butler MRN: [id number] DOB: 1927-05-02 Today's Date: 03/06/2021  History of Present Illness  87 year old female with history of migraine headaches, hypothyroidism, who comes into the hospital with persistent headaches, nausea, vomiting.  She was also found to be incidentally COVID-positive and due to her weakness, nausea in the ED, poor control of her symptoms she was admitted 03/05/21 to the hospital  Clinical Impression  Patient evaluated by Physical Therapy with no further acute PT needs identified. All education has been completed and the patient has no further questions. Pt mobilizing very well and ambulated 200 feet with RW.  Pt typically modified independent with rollator in independent living facility.  Pt plans to return to facility in quarantine.  See below for any follow-up Physical Therapy or equipment needs. PT is signing off. Thank you for this referral.        Recommendations for follow up therapy are one component of a multi-disciplinary discharge planning process, led by the attending physician.  Recommendations may be updated based on patient status, additional functional criteria and insurance authorization.  Follow Up Recommendations No PT follow up    Equipment Recommendations  None recommended by PT    Recommendations for Other Services       Precautions / Restrictions Precautions Precautions: Fall      Mobility  Bed Mobility Overal bed mobility: Modified Independent                  Transfers Overall transfer level: Needs assistance Equipment used: Rolling walker (2 wheeled) Transfers: Sit to/from Stand Sit to Stand: Supervision            Ambulation/Gait Ambulation/Gait assistance: Supervision Gait Distance (Feet): 200 Feet Assistive device: Rolling walker (2 wheeled) Gait Pattern/deviations: Step-through pattern;Decreased stride length     General Gait Details: pt reports  mild dyspnea and fatigue however hasn't been OOB in 3 days (per pt); SPo2 96% on room air after ambulating  Stairs            Wheelchair Mobility    Modified Rankin (Stroke Patients Only)       Balance Overall balance assessment: No apparent balance deficits (not formally assessed) (denies falls, uses rollator for safety with mobility per pt)                                           Pertinent Vitals/Pain Pain Assessment: No/denies pain    Home Living   Living Arrangements: Alone   Type of Home: Independent living facility         Home Equipment: Walker - 4 wheels      Prior Function Level of Independence: Independent with assistive device(s)         Comments: uses rollator for increased safety per pt, denies falls     Hand Dominance        Extremity/Trunk Assessment        Lower Extremity Assessment Lower Extremity Assessment: Overall WFL for tasks assessed    Cervical / Trunk Assessment Cervical / Trunk Assessment: Normal  Communication   Communication: No difficulties  Cognition Arousal/Alertness: Awake/alert Behavior During Therapy: WFL for tasks assessed/performed Overall Cognitive Status: Within Functional Limits for tasks assessed  General Comments      Exercises     Assessment/Plan    PT Assessment Patent does not need any further PT services  PT Problem List         PT Treatment Interventions      PT Goals (Current goals can be found in the Care Plan section)  Acute Rehab PT Goals PT Goal Formulation: All assessment and education complete, DC therapy    Frequency     Barriers to discharge        Co-evaluation               AM-PAC PT "6 Clicks" Mobility  Outcome Measure Help needed turning from your back to your side while in a flat bed without using bedrails?: None Help needed moving from lying on your back to sitting on the side of a  flat bed without using bedrails?: None Help needed moving to and from a bed to a chair (including a wheelchair)?: None Help needed standing up from a chair using your arms (e.g., wheelchair or bedside chair)?: None Help needed to walk in hospital room?: None Help needed climbing 3-5 steps with a railing? : A Little 6 Click Score: 23    End of Session   Activity Tolerance: Patient tolerated treatment well Patient left: in chair;with call bell/phone within reach;with chair alarm set;with family/visitor present;with nursing/sitter in room   PT Visit Diagnosis: Difficulty in walking, not elsewhere classified (R26.2)    Time: 1941-7408 PT Time Calculation (min) (ACUTE ONLY): 19 min   Charges:   PT Evaluation $PT Eval Low Complexity: 1 Low        Kati PT, DPT Acute Rehabilitation Services Pager: [phone] Office: [place] 03/06/2021, 3:36 PM

## 2021-03-06 NOTE — Progress Notes (Signed)
PROGRESS NOTE  Jeanette Butler MEQ:[id number] DOB: 1927-02-08 DOA: 03/05/2021 PCP: Vivi Barrack, MD   LOS: 1 day   Brief Narrative / Interim history: 87 year old female with history of migraine headaches, hypothyroidism, who comes into the hospital with persistent headaches, nausea, vomiting.  She was also found to be incidentally COVID-positive and due to her weakness, nausea in the ED, poor control of her symptoms she was admitted to the hospital  Subjective / 24h Interval events: Feels a little bit stronger this morning, does not feel nauseous but is still complaining of a headache.  Assessment & Plan: Principal Problem COVID-19 infection, generalized weakness -Patient admitted to the hospital and started on remdesivir, continue for a minimum of 3 days.  She has been complaining of a cough which started over the last 24 hours.  Remains weak, PT evaluation pending -Monitor inflammatory markers  Active Problems Intractable headache, nausea, vomiting -has a history of migraine headaches, resume home Fioricet.  Feels like her nausea is eased today, will allow diet  Depression -continue home medications  Hypothyroidism -continue home Synthroid   Scheduled Meds:  FLUoxetine  20 mg Oral Daily   levothyroxine  75 mcg Oral Q0600   zolpidem  5 mg Oral QHS   Continuous Infusions:  lactated ringers 75 mL/hr at 03/05/21 2125   remdesivir 100 mg in NS 100 mL 100 mg (03/06/21 0830)   PRN Meds:.butalbital-acetaminophen-caffeine  Diet Orders (From admission, onward)     Start     Ordered   03/05/21 2053  Diet regular Room service appropriate? Yes; Fluid consistency: Thin  Diet effective now       Question Answer Comment  Room service appropriate? Yes   Fluid consistency: Thin      03/05/21 2052            DVT prophylaxis: SCDs Start: 03/05/21 2052     Code Status: Full Code  Family Communication: Discussed with daughter over the phone  Status is: Inpatient  Remains  inpatient appropriate because: Persistent weakness, cough, Remdesivir  Level of care: Progressive  Consultants:  None  Procedures:  none  Microbiology  COVID-19 +10/60  Antimicrobials: Remdesivir 10/16   Objective: Vitals:   03/05/21 1854 03/05/21 2230 03/06/21 0214 03/06/21 0641  BP:  133/85 (!) 151/90 138/80  Pulse:  90 92 92  Resp:  20 20 20   Temp:  98.5 F (36.9 C) 98.2 F (36.8 C) 98.4 F (36.9 C)  TempSrc:  Oral Oral Oral  SpO2:  93% 96% 94%  Weight: 70 kg     Height: 5' 5.5" (1.664 m)       Intake/Output Summary (Last 24 hours) at 03/06/2021 1044 Last data filed at 03/06/2021 0656 Gross per 24 hour  Intake 1075.22 ml  Output 1000 ml  Net 75.22 ml   Filed Weights   03/05/21 1854  Weight: 70 kg    Examination: Constitutional: NAD Eyes: no scleral icterus ENMT: Mucous membranes are moist.  Neck: normal, supple Respiratory: clear to auscultation bilaterally, no wheezing, no crackles. Normal respiratory effort.  Cardiovascular: Regular rate and rhythm, no murmurs / rubs / gallops. No LE edema.  Abdomen: non distended, no tenderness. Bowel sounds positive.  Musculoskeletal: no clubbing / cyanosis.  Skin: no rashes Neurologic: CN 2-12 grossly intact. Strength 5/5 in all 4.   Data Reviewed: I have independently reviewed following labs and imaging studies   CBC: Recent Labs  Lab 03/05/21 1219 03/06/21 0426  WBC 7.7 5.6  NEUTROABS  --  4.9  HGB 13.4 13.2  HCT 39.9 39.8  MCV 88.3 90.2  PLT 213 741   Basic Metabolic Panel: Recent Labs  Lab 03/05/21 1219 03/06/21 0426  NA 135 135  K 3.3* 3.9  CL 101 105  CO2 20* 23  GLUCOSE 141* 163*  BUN 9 9  CREATININE 0.57 0.52  CALCIUM 9.0 8.3*  MG 1.7  --    Liver Function Tests: Recent Labs  Lab 03/05/21 1219 03/06/21 0426  AST 17 20  ALT 11 14  ALKPHOS 122 111  BILITOT 0.5 0.4  PROT 6.8 6.7  ALBUMIN 4.0 3.4*   Coagulation Profile: No results for input(s): INR, PROTIME in the last 168  hours. HbA1C: No results for input(s): HGBA1C in the last 72 hours. CBG: No results for input(s): GLUCAP in the last 168 hours.  Recent Results (from the past 240 hour(s))  Resp Panel by RT-PCR (Flu A&B, Covid) Nasopharyngeal Swab     Status: Abnormal   Collection Time: 03/05/21  1:41 PM   Specimen: Nasopharyngeal Swab; Nasopharyngeal(NP) swabs in vial transport medium  Result Value Ref Range Status   SARS Coronavirus 2 by RT PCR POSITIVE (A) NEGATIVE Final    Comment: RESULT CALLED TO, READ BACK BY AND VERIFIED WITH: Etta Quill @ 2878 03/05/2021 PERRY,J. (NOTE) SARS-CoV-2 target nucleic acids are DETECTED.  The SARS-CoV-2 RNA is generally detectable in upper respiratory specimens during the acute phase of infection. Positive results are indicative of the presence of the identified virus, but do not rule out bacterial infection or co-infection with other pathogens not detected by the test. Clinical correlation with patient history and other diagnostic information is necessary to determine patient infection status. The expected result is Negative.  Fact Sheet for Patients: EntrepreneurPulse.com.au  Fact Sheet for Healthcare Providers: IncredibleEmployment.be  This test is not yet approved or cleared by the [place] FDA and  has been authorized for detection and/or diagnosis of SARS-CoV-2 by FDA under an Emergency Use Authorization (EUA).  This EUA will remain in effect (meaning this tes t can be used) for the duration of  the COVID-19 declaration under Section 564(b)(1) of the Act, 21 U.S.C. section 360bbb-3(b)(1), unless the authorization is terminated or revoked sooner.     Influenza A by PCR NEGATIVE NEGATIVE Final   Influenza B by PCR NEGATIVE NEGATIVE Final    Comment: (NOTE) The Xpert Xpress SARS-CoV-2/FLU/RSV plus assay is intended as an aid in the diagnosis of influenza from Nasopharyngeal swab specimens and should not be  used as a sole basis for treatment. Nasal washings and aspirates are unacceptable for Xpert Xpress SARS-CoV-2/FLU/RSV testing.  Fact Sheet for Patients: EntrepreneurPulse.com.au  Fact Sheet for Healthcare Providers: IncredibleEmployment.be  This test is not yet approved or cleared by the [place] FDA and has been authorized for detection and/or diagnosis of SARS-CoV-2 by FDA under an Emergency Use Authorization (EUA). This EUA will remain in effect (meaning this test can be used) for the duration of the COVID-19 declaration under Section 564(b)(1) of the Act, 21 U.S.C. section 360bbb-3(b)(1), unless the authorization is terminated or revoked.  Performed at [facility], [address], [place], [place] [address]      Radiology Studies: CT Head Wo Contrast  Result Date: 03/05/2021 CLINICAL DATA:  Headache, intracranial hemorrhage suspected EXAM: CT HEAD WITHOUT CONTRAST TECHNIQUE: Contiguous axial images were obtained from the base of the skull through the vertex without intravenous contrast. COMPARISON:  None. FINDINGS: Brain: No evidence of acute infarction, hemorrhage,  hydrocephalus, extra-axial collection or mass lesion/mass effect. Mild-moderate low-density changes within the periventricular and subcortical white matter compatible with chronic microvascular ischemic change. Mild diffuse cerebral volume loss. Vascular: Atherosclerotic calcifications involving the large vessels of the skull base. No unexpected hyperdense vessel. Skull: Normal. Negative for fracture or focal lesion. Sinuses/Orbits: Partial opacification of the right mastoid air cells. Other: None. IMPRESSION: 1. No acute intracranial findings. 2. Chronic microvascular ischemic change and cerebral volume loss. 3. Partial opacification of the right mastoid air cells. Electronically Signed   By: Davina Poke D.O.   On: 03/05/2021 14:27   DG Chest Port 1  View  Result Date: 03/05/2021 CLINICAL DATA:  Cough, headache EXAM: PORTABLE CHEST 1 VIEW COMPARISON:  11/05/2017 FINDINGS: Heart and mediastinal contours are within normal limits. No focal opacities or effusions. No acute bony abnormality. IMPRESSION: No active disease. Electronically Signed   By: Rolm Baptise M.D.   On: 03/05/2021 17:52     Marzetta Board, MD, PhD [facility]  Between 7 am - 7 pm I am available, please contact me via Amion (for emergencies) or Securechat (non urgent messages)  Between 7 pm - 7 am I am not available, please contact night coverage MD/APP via Amion

## 2021-03-06 NOTE — Evaluation (Addendum)
Occupational Therapy Evaluation Patient Details Name: Jeanette Butler MRN: [id number] DOB: [birth year] Today's Date: 03/06/2021   History of Present Illness 87 year old female with history of migraine headaches, hypothyroidism, who comes into the hospital with persistent headaches, nausea, vomiting.  She was also found to be incidentally COVID-positive and due to her weakness, nausea in the ED, poor control of her symptoms she was admitted 03/05/21 to the hospital   Clinical Impression   Patient evaluated by Occupational Therapy with no further acute OT needs identified. All education has been completed and the patient has no further questions. Patient is at MI for ADLs with assist needed only to advance IV pole. Patient and patients daughter endorse patient being at baseline at this time.  See below for any follow-up Occupational Therapy or equipment needs. OT is signing off. Thank you for this referral.       Recommendations for follow up therapy are one component of a multi-disciplinary discharge planning process, led by the attending physician.  Recommendations may be updated based on patient status, additional functional criteria and insurance authorization.   Follow Up Recommendations  No OT follow up    Equipment Recommendations  None recommended by OT    Recommendations for Other Services       Precautions / Restrictions Precautions Precautions: Fall Restrictions Weight Bearing Restrictions: No      Mobility Bed Mobility Overal bed mobility: Modified Independent                  Transfers Overall transfer level: Needs assistance Equipment used: Rolling walker (2 wheeled) Transfers: Sit to/from Stand Sit to Stand: Supervision              Balance Overall balance assessment: No apparent balance deficits (not formally assessed)                                         ADL either performed or assessed with clinical judgement   ADL Overall  ADL's : Modified independent                                       General ADL Comments: with RW for ADL tasks. patient and daughter who is present in room endourse patient being at baseline at this time. patient was able to complete toileting tasks, LB dressing and grooming tasks standign with MI no SOB and no LOB     Vision Patient Visual Report: No change from baseline       Perception     Praxis      Pertinent Vitals/Pain Pain Assessment: No/denies pain     Hand Dominance Right   Extremity/Trunk Assessment Upper Extremity Assessment Upper Extremity Assessment: Overall WFL for tasks assessed   Lower Extremity Assessment Lower Extremity Assessment: Defer to PT evaluation   Cervical / Trunk Assessment Cervical / Trunk Assessment: Normal   Communication Communication Communication: No difficulties   Cognition Arousal/Alertness: Awake/alert Behavior During Therapy: WFL for tasks assessed/performed Overall Cognitive Status: Within Functional Limits for tasks assessed                                     General Comments       Exercises     Shoulder  Instructions      Home Living Family/patient expects to be discharged to:: Private residence Living Arrangements: Alone   Type of Home: [place]: One level     Bathroom Shower/Tub: Teacher, early years/pre: [place]: Environmental consultant - 4 wheels          Prior Functioning/Environment Level of Independence: Independent with assistive device(s)        Comments: uses rollator for increased safety per pt, denies falls, independent in ADLs, attends activites ect        OT Problem List:        OT Treatment/Interventions:      OT Goals(Current goals can be found in the care plan section) Acute Rehab OT Goals OT Goal Formulation: All assessment and education complete, DC therapy  OT Frequency:     Barriers to D/C:             Co-evaluation              AM-PAC OT "6 Clicks" Daily Activity     Outcome Measure Help from another person eating meals?: None Help from another person taking care of personal grooming?: None Help from another person toileting, which includes using toliet, bedpan, or urinal?: None Help from another person bathing (including washing, rinsing, drying)?: None Help from another person to put on and taking off regular upper body clothing?: None Help from another person to put on and taking off regular lower body clothing?: None 6 Click Score: 24   End of Session Equipment Utilized During Treatment: Rolling walker Nurse Communication: Other (comment) (nurse cleared patient to participate)  Activity Tolerance: Patient tolerated treatment well Patient left: in bed;with call bell/phone within reach;with family/visitor present  OT Visit Diagnosis: Unsteadiness on feet (R26.81)                Time: 9767-3419 OT Time Calculation (min): 19 min Charges:  OT General Charges $OT Visit: 1 Visit OT Evaluation $OT Eval Low Complexity: 1 Low  Jackelyn Poling OTR/L, MS Acute Rehabilitation Department Office# [phone] Pager# [phone]   [place] 03/06/2021, 4:19 PM

## 2021-03-07 DIAGNOSIS — U071 COVID-19: Secondary | ICD-10-CM | POA: Diagnosis not present

## 2021-03-07 DIAGNOSIS — E039 Hypothyroidism, unspecified: Secondary | ICD-10-CM | POA: Diagnosis not present

## 2021-03-07 DIAGNOSIS — R519 Headache, unspecified: Secondary | ICD-10-CM | POA: Diagnosis not present

## 2021-03-07 DIAGNOSIS — R051 Acute cough: Secondary | ICD-10-CM | POA: Diagnosis not present

## 2021-03-07 LAB — COMPREHENSIVE METABOLIC PANEL
ALT: 15 U/L (ref 0–44)
AST: 22 U/L (ref 15–41)
Albumin: 3.6 g/dL (ref 3.5–5.0)
Alkaline Phosphatase: 101 U/L (ref 38–126)
Anion gap: 8 (ref 5–15)
BUN: 13 mg/dL (ref 8–23)
CO2: 25 mmol/L (ref 22–32)
Calcium: 8.2 mg/dL — ABNORMAL LOW (ref 8.9–10.3)
Chloride: 101 mmol/L (ref 98–111)
Creatinine, Ser: 0.54 mg/dL (ref 0.44–1.00)
GFR, Estimated: >60 mL/min (ref >60)
Glucose, Bld: 120 mg/dL — ABNORMAL HIGH (ref 70–99)
Potassium: 3.4 mmol/L — ABNORMAL LOW (ref 3.5–5.1)
Sodium: 134 mmol/L — ABNORMAL LOW (ref 135–145)
Total Bilirubin: 0.5 mg/dL (ref 0.3–1.2)
Total Protein: 6.5 g/dL (ref 6.5–8.1)

## 2021-03-07 LAB — CBC WITH DIFFERENTIAL/PLATELET
Abs Immature Granulocytes: 0.03 10*3/uL (ref 0.00–0.07)
Basophils Absolute: 0 10*3/uL (ref 0.0–0.1)
Basophils Relative: 0 %
Eosinophils Absolute: 0 10*3/uL (ref 0.0–0.5)
Eosinophils Relative: 0 %
HCT: 39.9 % (ref 36.0–46.0)
Hemoglobin: 13.4 g/dL (ref 12.0–15.0)
Immature Granulocytes: 1 %
Lymphocytes Relative: 16 %
Lymphs Abs: 1.1 10*3/uL (ref 0.7–4.0)
MCH: 30.2 pg (ref 26.0–34.0)
MCHC: 33.6 g/dL (ref 30.0–36.0)
MCV: 89.9 fL (ref 80.0–100.0)
Monocytes Absolute: 1.6 10*3/uL — ABNORMAL HIGH (ref 0.1–1.0)
Monocytes Relative: 24 %
Neutro Abs: 3.9 10*3/uL (ref 1.7–7.7)
Neutrophils Relative %: 59 %
Platelets: 186 10*3/uL (ref 150–400)
RBC: 4.44 MIL/uL (ref 3.87–5.11)
RDW: 13.8 % (ref 11.5–15.5)
WBC: 6.6 10*3/uL (ref 4.0–10.5)
nRBC: 0 % (ref 0.0–0.2)

## 2021-03-07 LAB — C-REACTIVE PROTEIN: CRP: 9.1 mg/dL — ABNORMAL HIGH (ref <1.0)

## 2021-03-07 LAB — D-DIMER, QUANTITATIVE: D-Dimer, Quant: 1.8 ug/mL-FEU — ABNORMAL HIGH (ref 0.00–0.50)

## 2021-03-07 MED ORDER — POTASSIUM CHLORIDE CRYS ER 20 MEQ PO TBCR
40.0000 meq | EXTENDED_RELEASE_TABLET | Freq: Once | ORAL | Status: AC
  Administered 2021-03-07: 40 meq via ORAL
  Filled 2021-03-07: qty 2

## 2021-03-07 MED ORDER — ONDANSETRON HCL 4 MG/2ML IJ SOLN: 4.0000 mg | Freq: Four times a day (QID) | INTRAMUSCULAR | Status: DC | PRN

## 2021-03-07 NOTE — Progress Notes (Signed)
PROGRESS NOTE  Jeanette Butler BJS:[id number] DOB: April 24, 1927 DOA: 03/05/2021 PCP: Vivi Barrack, MD   LOS: 2 days   Brief Narrative / Interim history: 87 year old female with history of migraine headaches, hypothyroidism, who comes into the hospital with persistent headaches, nausea, vomiting.  She was also found to be incidentally COVID-positive and due to her weakness, nausea in the ED, poor control of her symptoms she was admitted to the hospital  Subjective / 24h Interval events: She tells me she is feeling very poorly this morning.  She is extremely fatigued, and all she wants to do is sleep.  She reports nausea as well  Assessment & Plan: Principal Problem COVID-19 infection, generalized weakness -Patient admitted to the hospital and started on remdesivir, continue for 3 days.  Chest x-ray on admission was negative and does not have significant respiratory symptoms.  She has been having a cough but daughter told me that this is chronic. -Decreased with PT yesterday and was feeling quite good but today it seems that she took a turn and is feeling poorly, significant weakness is present, as well as nausea  Active Problems Intractable headache, nausea, vomiting -has a history of migraine headaches, resume home Fioricet.  Nausea today, continue as needed's  Depression -continue home medications  Hypothyroidism -continue home Synthroid   Scheduled Meds:  FLUoxetine  20 mg Oral Daily   levothyroxine  75 mcg Oral Q0600   zolpidem  5 mg Oral QHS   Continuous Infusions:   PRN Meds:.butalbital-acetaminophen-caffeine, guaiFENesin-dextromethorphan, ondansetron (ZOFRAN) IV  Diet Orders (From admission, onward)     Start     Ordered   03/05/21 2053  Diet regular Room service appropriate? Yes; Fluid consistency: Thin  Diet effective now       Question Answer Comment  Room service appropriate? Yes   Fluid consistency: Thin      03/05/21 2052            DVT prophylaxis: SCDs  Start: 03/05/21 2052     Code Status: Full Code  Family Communication: Discussed with daughter over the phone  Status is: Inpatient  Remains inpatient appropriate because: Persistent weakness, cough, Remdesivir  Level of care: Progressive  Consultants:  None  Procedures:  none  Microbiology  COVID-19 +10/60  Antimicrobials: Remdesivir 10/16   Objective: Vitals:   03/06/21 0641 03/06/21 1157 03/06/21 2120 03/07/21 0558  BP: 138/80 111/71 (!) 173/84 (!) 145/88  Pulse: 92 92 91 88  Resp: 20 18 18 18   Temp: 98.4 F (36.9 C) 98.2 F (36.8 C) 98.1 F (36.7 C) 98.4 F (36.9 C)  TempSrc: Oral Oral Oral Oral  SpO2: 94% 95% (!) 86% 95%  Weight:      Height:        Intake/Output Summary (Last 24 hours) at 03/07/2021 1154 Last data filed at 03/07/2021 6073 Gross per 24 hour  Intake 1564.89 ml  Output 300 ml  Net 1264.89 ml    Filed Weights   03/05/21 1854  Weight: 70 kg    Examination: Constitutional: No apparent distress Eyes: Anicteric ENMT: mmm Neck: normal, supple Respiratory: CTA bilaterally without wheezing or crackles, normal respiratory effort Cardiovascular: Regular rate and rhythm, no peripheral edema Abdomen: Soft, bowel sounds positive Musculoskeletal: no clubbing / cyanosis.  Skin: No rash seen Neurologic: No focal deficits  Data Reviewed: I have independently reviewed following labs and imaging studies   CBC: Recent Labs  Lab 03/05/21 1219 03/06/21 0426 03/07/21 0354  WBC 7.7 5.6 6.6  NEUTROABS  --  4.9 3.9  HGB 13.4 13.2 13.4  HCT 39.9 39.8 39.9  MCV 88.3 90.2 89.9  PLT 213 199 417    Basic Metabolic Panel: Recent Labs  Lab 03/05/21 1219 03/06/21 0426 03/07/21 0354  NA 135 135 134*  K 3.3* 3.9 3.4*  CL 101 105 101  CO2 20* 23 25  GLUCOSE 141* 163* 120*  BUN 9 9 13   CREATININE 0.57 0.52 0.54  CALCIUM 9.0 8.3* 8.2*  MG 1.7  --   --     Liver Function Tests: Recent Labs  Lab 03/05/21 1219 03/06/21 0426  03/07/21 0354  AST 17 20 22   ALT 11 14 15   ALKPHOS 122 111 101  BILITOT 0.5 0.4 0.5  PROT 6.8 6.7 6.5  ALBUMIN 4.0 3.4* 3.6    Coagulation Profile: No results for input(s): INR, PROTIME in the last 168 hours. HbA1C: No results for input(s): HGBA1C in the last 72 hours. CBG: No results for input(s): GLUCAP in the last 168 hours.  Recent Results (from the past 240 hour(s))  Resp Panel by RT-PCR (Flu A&B, Covid) Nasopharyngeal Swab     Status: Abnormal   Collection Time: 03/05/21  1:41 PM   Specimen: Nasopharyngeal Swab; Nasopharyngeal(NP) swabs in vial transport medium  Result Value Ref Range Status   SARS Coronavirus 2 by RT PCR POSITIVE (A) NEGATIVE Final    Comment: RESULT CALLED TO, READ BACK BY AND VERIFIED WITH: Etta Quill @ 4081 03/05/2021 PERRY,J. (NOTE) SARS-CoV-2 target nucleic acids are DETECTED.  The SARS-CoV-2 RNA is generally detectable in upper respiratory specimens during the acute phase of infection. Positive results are indicative of the presence of the identified virus, but do not rule out bacterial infection or co-infection with other pathogens not detected by the test. Clinical correlation with patient history and other diagnostic information is necessary to determine patient infection status. The expected result is Negative.  Fact Sheet for Patients: EntrepreneurPulse.com.au  Fact Sheet for Healthcare Providers: IncredibleEmployment.be  This test is not yet approved or cleared by the [place] FDA and  has been authorized for detection and/or diagnosis of SARS-CoV-2 by FDA under an Emergency Use Authorization (EUA).  This EUA will remain in effect (meaning this tes t can be used) for the duration of  the COVID-19 declaration under Section 564(b)(1) of the Act, 21 U.S.C. section 360bbb-3(b)(1), unless the authorization is terminated or revoked sooner.     Influenza A by PCR NEGATIVE NEGATIVE Final    Influenza B by PCR NEGATIVE NEGATIVE Final    Comment: (NOTE) The Xpert Xpress SARS-CoV-2/FLU/RSV plus assay is intended as an aid in the diagnosis of influenza from Nasopharyngeal swab specimens and should not be used as a sole basis for treatment. Nasal washings and aspirates are unacceptable for Xpert Xpress SARS-CoV-2/FLU/RSV testing.  Fact Sheet for Patients: EntrepreneurPulse.com.au  Fact Sheet for Healthcare Providers: IncredibleEmployment.be  This test is not yet approved or cleared by the [place] FDA and has been authorized for detection and/or diagnosis of SARS-CoV-2 by FDA under an Emergency Use Authorization (EUA). This EUA will remain in effect (meaning this test can be used) for the duration of the COVID-19 declaration under Section 564(b)(1) of the Act, 21 U.S.C. section 360bbb-3(b)(1), unless the authorization is terminated or revoked.  Performed at [facility], [address] [place], [place] [address]       Radiology Studies: No results found.   Marzetta Board, MD, PhD [facility]  Between 7 am - 7 pm I  am available, please contact me via Amion (for emergencies) or Securechat (non urgent messages)  Between 7 pm - 7 am I am not available, please contact night coverage MD/APP via Amion

## 2021-03-07 NOTE — Plan of Care (Signed)
  Problem: Education: Goal: Knowledge of risk factors and measures for prevention of condition will improve Outcome: Progressing   Problem: Respiratory: Goal: Will maintain a patent airway Outcome: Progressing Goal: Complications related to the disease process, condition or treatment will be avoided or minimized Outcome: Progressing   

## 2021-03-08 DIAGNOSIS — U071 COVID-19: Secondary | ICD-10-CM | POA: Diagnosis not present

## 2021-03-08 DIAGNOSIS — R051 Acute cough: Secondary | ICD-10-CM | POA: Diagnosis not present

## 2021-03-08 DIAGNOSIS — R519 Headache, unspecified: Secondary | ICD-10-CM | POA: Diagnosis not present

## 2021-03-08 DIAGNOSIS — E039 Hypothyroidism, unspecified: Secondary | ICD-10-CM | POA: Diagnosis not present

## 2021-03-08 NOTE — Evaluation (Signed)
Physical Therapy Re-Evaluation Only Patient Details Name: Jeanette Butler MRN: [id number] DOB: 06-12-1926 Today's Date: 03/08/2021  History of Present Illness  87 year old female with history of migraine headaches, hypothyroidism, who comes into the hospital with persistent headaches, nausea, vomiting.  She was also found to be incidentally COVID-positive and due to her weakness, nausea in the ED, poor control of her symptoms she was admitted 03/05/21 to the hospital   Clinical Impression  Pt continues to demonstrate good safety and strength with transfers and ambulation using rollator, tolerates 100 ft with rollator navigating past obstacles, completing turns without LOB or dyspnea. Pt able to perform seated BLE strengthening exercises, educated on performing as tolerable, ambulating to restroom and around room with nursing as able; pt verbalizes agreement. Pt reports feeling weaker than normal and not quite to baseline, but main complaint is being cold. Pt reports feeling "awful" yesterday and better today, reports family will be checking on her and providing meals once home.Pt up in chair with warm blanket at EOS  and RN entering room with medication. No acute PT needs identified, will sign off at this time.     Recommendations for follow up therapy are one component of a multi-disciplinary discharge planning process, led by the attending physician.  Recommendations may be updated based on patient status, additional functional criteria and insurance authorization.  Follow Up Recommendations No PT follow up    Equipment Recommendations  None recommended by PT    Recommendations for Other Services       Precautions / Restrictions Precautions Precautions: Fall Restrictions Weight Bearing Restrictions: No      Mobility  Bed Mobility  General bed mobility comments: in recliner    Transfers Equipment used: 4-wheeled walker Transfers: Sit to/from Stand Sit to Stand: Modified independent  (Device/Increase time);Supervision  General transfer comment: mod I-supv with transfers using rollator, good steadiness upon rising  Ambulation/Gait Ambulation/Gait assistance: Supervision;Modified independent (Device/Increase time) Gait Distance (Feet): 100 Feet Assistive device: 4-wheeled walker Gait Pattern/deviations: Step-through pattern;Decreased stride length Gait velocity: slightly decreased   General Gait Details: initially hesitant reporting fear of falling, improved with encouragement, amb 100 ft with rollator, no LOB, no dyspnea completing turns and navigating past obstacles  Stairs            Wheelchair Mobility    Modified Rankin (Stroke Patients Only)       Balance Overall balance assessment: No apparent balance deficits (not formally assessed) (reports fear of falling though no falls history)       Pertinent Vitals/Pain Pain Assessment: No/denies pain    Home Living Family/patient expects to be discharged to:: Private residence Living Arrangements: Alone Type of Home: Independent living facility  Home Layout: One level Home Equipment: Environmental consultant - 4 wheels      Prior Function Level of Independence: Independent with assistive device(s)  Comments: uses rollator for increased safety per pt, denies falls, independent in ADLs, attends activites ect     Hand Dominance   Dominant Hand: Right    Extremity/Trunk Assessment   Upper Extremity Assessment Upper Extremity Assessment: Overall WFL for tasks assessed  Lower Extremity Assessment Lower Extremity Assessment: Overall WFL for tasks assessed  Cervical / Trunk Assessment Cervical / Trunk Assessment: Normal  Communication   Communication: No difficulties  Cognition Arousal/Alertness: Awake/alert Behavior During Therapy: WFL for tasks assessed/performed Overall Cognitive Status: Within Functional Limits for tasks assessed     General Comments General comments (skin integrity, edema, etc.): on RA  with SpO2 >92%  Exercises General Exercises - Lower Extremity Long Arc Quad: Seated;AROM;Strengthening;Both;15 reps Hip Flexion/Marching: Seated;AROM;Strengthening;Both;15 reps Heel Raises: Seated;AROM;Strengthening;Both;15 reps   Assessment/Plan    PT Assessment Patent does not need any further PT services  PT Problem List         PT Treatment Interventions      PT Goals (Current goals can be found in the Care Plan section)  Acute Rehab PT Goals Patient Stated Goal: "home tomorrow hopefully" PT Goal Formulation: All assessment and education complete, DC therapy    Frequency     Barriers to discharge        Co-evaluation               AM-PAC PT "6 Clicks" Mobility  Outcome Measure Help needed turning from your back to your side while in a flat bed without using bedrails?: None Help needed moving from lying on your back to sitting on the side of a flat bed without using bedrails?: None Help needed moving to and from a bed to a chair (including a wheelchair)?: None Help needed standing up from a chair using your arms (e.g., wheelchair or bedside chair)?: None Help needed to walk in hospital room?: None Help needed climbing 3-5 steps with a railing? : A Little 6 Click Score: 23    End of Session   Activity Tolerance: Patient tolerated treatment well Patient left: in chair;with call bell/phone within reach;with nursing/sitter in room Nurse Communication: Mobility status PT Visit Diagnosis: Difficulty in walking, not elsewhere classified (R26.2)    Time: 1417-1440 PT Time Calculation (min) (ACUTE ONLY): 23 min   Charges:   PT Evaluation $PT Re-evaluation: 1 Re-eval PT Treatments $Therapeutic Exercise: 8-22 mins         Tori Omie Ferger PT, DPT 03/08/21, 2:56 PM

## 2021-03-08 NOTE — TOC Initial Note (Signed)
Transition of Care [facility]) - Initial/Assessment Note    Patient Details  Name: Jeanette Butler MRN: [id number] Date of Birth: Aug 17, 1926  [facility]) CM/SW Contact:    Leeroy Cha, RN Phone Number: 03/08/2021, 9:17 AM  Clinical Narrative:                 87 year old female with history of migraine headaches, hypothyroidism, who comes into the hospital with persistent headaches, nausea, vomiting.  She was also found to be incidentally COVID-positive and due to her weakness, nausea in the ED, poor control of her symptoms she was admitted to the hospital   Subjective / 24h Interval events: She tells me she is feeling very poorly this morning.  She is extremely fatigued, and all she wants to do is sleep.  She reports nausea as well   Assessment & Plan: Principal Problem COVID-19 infection, generalized weakness -Patient admitted to the hospital and started on remdesivir, continue for 3 days.  Chest x-ray on admission was negative and does not have significant respiratory symptoms.  She has been having a cough but daughter told me that this is chronic. -Decreased with PT yesterday and was feeling quite good but today it seems that she took a turn and is feeling poorly, significant weakness is present, as well as nausea   Active Problems Intractable headache, nausea, vomiting -has a history of migraine headaches, resume home Fioricet.  Nausea today, continue as needed's   Depression -continue home medications   Hypothyroidism -continue home Synthroid  TOC PLAN OF CARE: Following for toc needs and propgression  Expected Discharge Plan: Home/Self Care Barriers to Discharge: Continued Medical Work up   Patient Goals and CMS Choice Patient states their goals for this hospitalization and ongoing recovery are:: not asked due to covid dx      Expected Discharge Plan and Services Expected Discharge Plan: Home/Self Care   Discharge Planning Services: CM Consult   Living  arrangements for the past 2 months: Single Family Home                                      Prior Living Arrangements/Services Living arrangements for the past 2 months: Single Family Home Lives with:: Self Patient language and need for interpreter reviewed:: Yes                 Activities of Daily Living Home Assistive Devices/Equipment: Environmental consultant (specify type) ADL Screening (condition at time of admission) Patient's cognitive ability adequate to safely complete daily activities?: Yes Is the patient deaf or have difficulty hearing?: No Does the patient have difficulty seeing, even when wearing glasses/contacts?: No Does the patient have difficulty concentrating, remembering, or making decisions?: No Patient able to express need for assistance with ADLs?: Yes Does the patient have difficulty dressing or bathing?: No Independently performs ADLs?: Yes (appropriate for developmental age) Does the patient have difficulty walking or climbing stairs?: Yes Weakness of Legs: Both Weakness of Arms/Hands: None  Permission Sought/Granted                  Emotional Assessment Appearance:: Appears stated age     Orientation: : Oriented to Self, Oriented to Place, Oriented to  Time, Oriented to Situation Alcohol / Substance Use: Not Applicable Psych Involvement: No (comment)  Admission diagnosis:  Cough [R05.9] Nausea & vomiting [R11.2] Intractable headache [R51.9] Patient Active Problem List   Diagnosis Date  Noted   Nausea & vomiting 03/05/2021   Intractable headache 03/05/2021   COVID-19 virus infection    Urinary frequency 10/04/2017   Insomnia 10/04/2017   Major depression in remission ([place]) 09/09/2017   Anxiety 09/09/2017   Hypothyroidism 09/09/2017   GERD (gastroesophageal reflux disease) 09/09/2017   Atrophic vaginitis 09/09/2017   Migraines 09/09/2017   Allergic rhinitis 09/09/2017   HNP (herniated nucleus pulposus), lumbar 08/27/2016   Herniated nucleus  pulposus 08/26/2016   PCP:  Vivi Barrack, MD Pharmacy:   [place], [place] [address] [place] [address] Phone: [phone] Fax: [phone]     Social Determinants of Health ([place]) Interventions    Readmission Risk Interventions No flowsheet data found.

## 2021-03-08 NOTE — Plan of Care (Signed)
  Problem: Education: Goal: Knowledge of risk factors and measures for prevention of condition will improve Outcome: Progressing   Problem: Coping: Goal: Psychosocial and spiritual needs will be supported Outcome: Progressing   Problem: Respiratory: Goal: Will maintain a patent airway Outcome: Progressing Goal: Complications related to the disease process, condition or treatment will be avoided or minimized Outcome: Progressing   

## 2021-03-08 NOTE — Progress Notes (Signed)
PROGRESS NOTE  Jeanette Butler ZOX:[id number] DOB: February 24, 1927 DOA: 03/05/2021 PCP: Vivi Barrack, MD   LOS: 3 days   Brief Narrative / Interim history: 87 year old female with history of migraine headaches, hypothyroidism, who comes into the hospital with persistent headaches, nausea, vomiting.  She was also found to be incidentally COVID-positive and due to her weakness, nausea in the ED, poor control of her symptoms she was admitted to the hospital  Subjective / 24h Interval events: She was yesterday morning, better but not back to baseline  Assessment & Plan: Principal Problem COVID-19 infection, generalized weakness -Patient admitted to the hospital and started on remdesivir, completed a prophylactic course of 3 days.  Chest x-ray on admission was negative and does not have significant respiratory symptoms except for generalized weakness.  -Weakness seems to be a little bit better today, worked again with physical therapy.  If she continues to improve she will likely be stable for discharge back to her independent living facility tomorrow  Active Problems Intractable headache, nausea, vomiting -has a history of migraine headaches, resume home Fioricet.  Nausea has resolved currently  Depression -continue home medications  Hypothyroidism -continue home Synthroid   Scheduled Meds:  FLUoxetine  20 mg Oral Daily   levothyroxine  75 mcg Oral Q0600   zolpidem  5 mg Oral QHS   Continuous Infusions:   PRN Meds:.butalbital-acetaminophen-caffeine, guaiFENesin-dextromethorphan, ondansetron (ZOFRAN) IV  Diet Orders (From admission, onward)     Start     Ordered   03/05/21 2053  Diet regular Room service appropriate? Yes; Fluid consistency: Thin  Diet effective now       Question Answer Comment  Room service appropriate? Yes   Fluid consistency: Thin      03/05/21 2052            DVT prophylaxis: SCDs Start: 03/05/21 2052     Code Status: Full Code  Family Communication:  Discussed with daughter over the phone  Status is: Inpatient  Remains inpatient appropriate because: Persistent weakness, cough, Remdesivir  Level of care: Progressive  Consultants:  None  Procedures:  none  Microbiology  COVID-19 +10/60  Antimicrobials: Remdesivir 10/16   Objective: Vitals:   03/07/21 1402 03/07/21 2112 03/08/21 0602 03/08/21 1348  BP: 138/88 (!) 141/89 131/78 125/82  Pulse: 91 92 97 90  Resp: 16 20 20 18   Temp: 98.3 F (36.8 C) 98.4 F (36.9 C) 98.5 F (36.9 C) 99.3 F (37.4 C)  TempSrc: Oral Oral Oral Oral  SpO2: 99% 96% 94% 95%  Weight:      Height:        Intake/Output Summary (Last 24 hours) at 03/08/2021 1459 Last data filed at 03/08/2021 0800 Gross per 24 hour  Intake 300 ml  Output --  Net 300 ml    Filed Weights   03/05/21 1854  Weight: 70 kg    Examination: Constitutional: NAD, in bed Eyes: No scleral icterus ENMT: mmm Neck: normal, supple Respiratory: Clear bilaterally, no wheezing no crackles Cardiovascular: Regular rate and rhythm, no edema Abdomen: Soft, NT, ND, bowel sounds positive Musculoskeletal: no clubbing / cyanosis.  Skin: No rashes seen Neurologic: Nonfocal, equal strength  Data Reviewed: I have independently reviewed following labs and imaging studies   CBC: Recent Labs  Lab 03/05/21 1219 03/06/21 0426 03/07/21 0354  WBC 7.7 5.6 6.6  NEUTROABS  --  4.9 3.9  HGB 13.4 13.2 13.4  HCT 39.9 39.8 39.9  MCV 88.3 90.2 89.9  PLT 213 199 186  Basic Metabolic Panel: Recent Labs  Lab 03/05/21 1219 03/06/21 0426 03/07/21 0354  NA 135 135 134*  K 3.3* 3.9 3.4*  CL 101 105 101  CO2 20* 23 25  GLUCOSE 141* 163* 120*  BUN 9 9 13   CREATININE 0.57 0.52 0.54  CALCIUM 9.0 8.3* 8.2*  MG 1.7  --   --     Liver Function Tests: Recent Labs  Lab 03/05/21 1219 03/06/21 0426 03/07/21 0354  AST 17 20 22   ALT 11 14 15   ALKPHOS 122 111 101  BILITOT 0.5 0.4 0.5  PROT 6.8 6.7 6.5  ALBUMIN 4.0 3.4*  3.6    Coagulation Profile: No results for input(s): INR, PROTIME in the last 168 hours. HbA1C: No results for input(s): HGBA1C in the last 72 hours. CBG: No results for input(s): GLUCAP in the last 168 hours.  Recent Results (from the past 240 hour(s))  Resp Panel by RT-PCR (Flu A&B, Covid) Nasopharyngeal Swab     Status: Abnormal   Collection Time: 03/05/21  1:41 PM   Specimen: Nasopharyngeal Swab; Nasopharyngeal(NP) swabs in vial transport medium  Result Value Ref Range Status   SARS Coronavirus 2 by RT PCR POSITIVE (A) NEGATIVE Final    Comment: RESULT CALLED TO, READ BACK BY AND VERIFIED WITH: Etta Quill @ 3149 03/05/2021 PERRY,J. (NOTE) SARS-CoV-2 target nucleic acids are DETECTED.  The SARS-CoV-2 RNA is generally detectable in upper respiratory specimens during the acute phase of infection. Positive results are indicative of the presence of the identified virus, but do not rule out bacterial infection or co-infection with other pathogens not detected by the test. Clinical correlation with patient history and other diagnostic information is necessary to determine patient infection status. The expected result is Negative.  Fact Sheet for Patients: EntrepreneurPulse.com.au  Fact Sheet for Healthcare Providers: IncredibleEmployment.be  This test is not yet approved or cleared by the [place] FDA and  has been authorized for detection and/or diagnosis of SARS-CoV-2 by FDA under an Emergency Use Authorization (EUA).  This EUA will remain in effect (meaning this tes t can be used) for the duration of  the COVID-19 declaration under Section 564(b)(1) of the Act, 21 U.S.C. section 360bbb-3(b)(1), unless the authorization is terminated or revoked sooner.     Influenza A by PCR NEGATIVE NEGATIVE Final   Influenza B by PCR NEGATIVE NEGATIVE Final    Comment: (NOTE) The Xpert Xpress SARS-CoV-2/FLU/RSV plus assay is intended as an  aid in the diagnosis of influenza from Nasopharyngeal swab specimens and should not be used as a sole basis for treatment. Nasal washings and aspirates are unacceptable for Xpert Xpress SARS-CoV-2/FLU/RSV testing.  Fact Sheet for Patients: EntrepreneurPulse.com.au  Fact Sheet for Healthcare Providers: IncredibleEmployment.be  This test is not yet approved or cleared by the [place] FDA and has been authorized for detection and/or diagnosis of SARS-CoV-2 by FDA under an Emergency Use Authorization (EUA). This EUA will remain in effect (meaning this test can be used) for the duration of the COVID-19 declaration under Section 564(b)(1) of the Act, 21 U.S.C. section 360bbb-3(b)(1), unless the authorization is terminated or revoked.  Performed at [facility], [address] [place], [place] [address]       Radiology Studies: No results found.   Marzetta Board, MD, PhD [facility]  Between 7 am - 7 pm I am available, please contact me via Amion (for emergencies) or Securechat (non urgent messages)  Between 7 pm - 7 am I am not available,  please contact night coverage MD/APP via Amion

## 2021-03-08 NOTE — Plan of Care (Signed)
  Problem: Education: Goal: Knowledge of risk factors and measures for prevention of condition will improve Outcome: Progressing   Problem: Coping: Goal: Psychosocial and spiritual needs will be supported Outcome: Progressing   Problem: Respiratory: Goal: Will maintain a patent airway Outcome: Progressing   

## 2021-03-09 ENCOUNTER — Encounter (HOSPITAL_COMMUNITY): Payer: Self-pay | Admitting: Internal Medicine

## 2021-03-09 DIAGNOSIS — U071 COVID-19: Secondary | ICD-10-CM | POA: Diagnosis not present

## 2021-03-09 DIAGNOSIS — R519 Headache, unspecified: Secondary | ICD-10-CM | POA: Diagnosis not present

## 2021-03-09 DIAGNOSIS — R112 Nausea with vomiting, unspecified: Secondary | ICD-10-CM | POA: Diagnosis not present

## 2021-03-09 LAB — CBC
HCT: 39.5 % (ref 36.0–46.0)
Hemoglobin: 13.1 g/dL (ref 12.0–15.0)
MCH: 29.8 pg (ref 26.0–34.0)
MCHC: 33.2 g/dL (ref 30.0–36.0)
MCV: 90 fL (ref 80.0–100.0)
Platelets: 211 10*3/uL (ref 150–400)
RBC: 4.39 MIL/uL (ref 3.87–5.11)
RDW: 13.9 % (ref 11.5–15.5)
WBC: 5.6 10*3/uL (ref 4.0–10.5)
nRBC: 0 % (ref 0.0–0.2)

## 2021-03-09 LAB — COMPREHENSIVE METABOLIC PANEL
ALT: 26 U/L (ref 0–44)
AST: 37 U/L (ref 15–41)
Albumin: 3.2 g/dL — ABNORMAL LOW (ref 3.5–5.0)
Alkaline Phosphatase: 101 U/L (ref 38–126)
Anion gap: 6 (ref 5–15)
BUN: 16 mg/dL (ref 8–23)
CO2: 27 mmol/L (ref 22–32)
Calcium: 8.3 mg/dL — ABNORMAL LOW (ref 8.9–10.3)
Chloride: 103 mmol/L (ref 98–111)
Creatinine, Ser: 0.52 mg/dL (ref 0.44–1.00)
GFR, Estimated: >60 mL/min (ref >60)
Glucose, Bld: 105 mg/dL — ABNORMAL HIGH (ref 70–99)
Potassium: 3.8 mmol/L (ref 3.5–5.1)
Sodium: 136 mmol/L (ref 135–145)
Total Bilirubin: 0.6 mg/dL (ref 0.3–1.2)
Total Protein: 6.2 g/dL — ABNORMAL LOW (ref 6.5–8.1)

## 2021-03-09 LAB — MAGNESIUM: Magnesium: 2 mg/dL (ref 1.7–2.4)

## 2021-03-09 MED ORDER — OMEPRAZOLE 20 MG PO CPDR: 20.0000 mg | DELAYED_RELEASE_CAPSULE | Freq: Every day | ORAL | Status: DC | PRN

## 2021-03-09 NOTE — Plan of Care (Signed)
  Problem: Coping: Goal: Psychosocial and spiritual needs will be supported Outcome: Progressing   Problem: Respiratory: Goal: Will maintain a patent airway Outcome: Progressing   Problem: Education: Goal: Knowledge of risk factors and measures for prevention of condition will improve Outcome: Progressing

## 2021-03-09 NOTE — Plan of Care (Signed)
Patient discharged.

## 2021-03-09 NOTE — Discharge Summary (Signed)
Physician Discharge Summary  Jeanette Butler PPJ:[id number] DOB: 1926-10-13 DOA: 03/05/2021  PCP: Jeanette Barrack, MD  Admit date: 03/05/2021 Discharge date: 03/09/2021  Discharge disposition: Home   Recommendations for Outpatient Follow-Up:   Follow-up with PCP in 1 week   Discharge Diagnosis:   Principal Problem:   Intractable headache Active Problems:   Hypothyroidism   Migraines   Nausea & vomiting   COVID-19 virus infection    Discharge Condition: Stable.  Diet recommendation:  Diet Order             Diet - low sodium heart healthy           Diet regular Room service appropriate? Yes; Fluid consistency: Thin  Diet effective now                     Code Status: Full Code     Hospital Course:   Ms. Jeanette Butler is a 87 year old woman with medical history significant for migraine headaches, hypothyroidism, anxiety, depression, recurrent UTI, GERD, who presented to the hospital with Headache, nausea and vomiting.  Incidentally, she was found to have COVID-19 infection.  She was treated with IV remdesivir infusion, analgesics and antiemetics.  She did well with physical therapy and low skilled PT follow-up was recommended.  Her condition slowly improved and she was deemed stable for discharge to home     Discharge Exam:    Vitals:   03/08/21 1348 03/08/21 2108 03/09/21 0539 03/09/21 0800  BP: 125/82 114/74 130/76   Pulse: 90 91 88   Resp: 18 16 20  (!) 21  Temp: 99.3 F (37.4 C) 98.2 F (36.8 C) 97.8 F (36.6 C)   TempSrc: Oral Oral Oral   SpO2: 95% 96% 98%   Weight:      Height:         GEN: NAD SKIN: Warm and dry EYES: EOMI ENT: MMM CV: RRR PULM: CTA B ABD: soft, ND, NT, +BS CNS: AAO x 3, non focal EXT: No edema or tenderness   The results of significant diagnostics from this hospitalization (including imaging, microbiology, ancillary and laboratory) are listed below for reference.     Procedures and Diagnostic Studies:   CT  Head Wo Contrast  Result Date: 03/05/2021 CLINICAL DATA:  Headache, intracranial hemorrhage suspected EXAM: CT HEAD WITHOUT CONTRAST TECHNIQUE: Contiguous axial images were obtained from the base of the skull through the vertex without intravenous contrast. COMPARISON:  None. FINDINGS: Brain: No evidence of acute infarction, hemorrhage, hydrocephalus, extra-axial collection or mass lesion/mass effect. Mild-moderate low-density changes within the periventricular and subcortical white matter compatible with chronic microvascular ischemic change. Mild diffuse cerebral volume loss. Vascular: Atherosclerotic calcifications involving the large vessels of the skull base. No unexpected hyperdense vessel. Skull: Normal. Negative for fracture or focal lesion. Sinuses/Orbits: Partial opacification of the right mastoid air cells. Other: None. IMPRESSION: 1. No acute intracranial findings. 2. Chronic microvascular ischemic change and cerebral volume loss. 3. Partial opacification of the right mastoid air cells. Electronically Signed   By: Davina Poke D.O.   On: 03/05/2021 14:27   DG Chest Port 1 View  Result Date: 03/05/2021 CLINICAL DATA:  Cough, headache EXAM: PORTABLE CHEST 1 VIEW COMPARISON:  11/05/2017 FINDINGS: Heart and mediastinal contours are within normal limits. No focal opacities or effusions. No acute bony abnormality. IMPRESSION: No active disease. Electronically Signed   By: Rolm Baptise M.D.   On: 03/05/2021 17:52     Labs:   Basic Metabolic Panel:  Recent Labs  Lab 03/05/21 1219 03/06/21 0426 03/07/21 0354 03/09/21 0413  NA 135 135 134* 136  K 3.3* 3.9 3.4* 3.8  CL 101 105 101 103  CO2 20* 23 25 27   GLUCOSE 141* 163* 120* 105*  BUN 9 9 13 16   CREATININE 0.57 0.52 0.54 0.52  CALCIUM 9.0 8.3* 8.2* 8.3*  MG 1.7  --   --  2.0   GFR Estimated Creatinine Clearance: 44.6 mL/min (by C-G formula based on SCr of 0.52 mg/dL). Liver Function Tests: Recent Labs  Lab 03/05/21 1219  03/06/21 0426 03/07/21 0354 03/09/21 0413  AST 17 20 22  37  ALT 11 14 15 26   ALKPHOS 122 111 101 101  BILITOT 0.5 0.4 0.5 0.6  PROT 6.8 6.7 6.5 6.2*  ALBUMIN 4.0 3.4* 3.6 3.2*   Recent Labs  Lab 03/05/21 1219  LIPASE 26   No results for input(s): AMMONIA in the last 168 hours. Coagulation profile No results for input(s): INR, PROTIME in the last 168 hours.  CBC: Recent Labs  Lab 03/05/21 1219 03/06/21 0426 03/07/21 0354 03/09/21 0413  WBC 7.7 5.6 6.6 5.6  NEUTROABS  --  4.9 3.9  --   HGB 13.4 13.2 13.4 13.1  HCT 39.9 39.8 39.9 39.5  MCV 88.3 90.2 89.9 90.0  PLT 213 199 186 211   Cardiac Enzymes: No results for input(s): CKTOTAL, CKMB, CKMBINDEX, TROPONINI in the last 168 hours. BNP: Invalid input(s): POCBNP CBG: No results for input(s): GLUCAP in the last 168 hours. D-Dimer Recent Labs    03/07/21 0354  DDIMER 1.80*   Hgb A1c No results for input(s): HGBA1C in the last 72 hours. Lipid Profile No results for input(s): CHOL, HDL, LDLCALC, TRIG, CHOLHDL, LDLDIRECT in the last 72 hours. Thyroid function studies No results for input(s): TSH, T4TOTAL, T3FREE, THYROIDAB in the last 72 hours.  Invalid input(s): FREET3 Anemia work up No results for input(s): VITAMINB12, FOLATE, FERRITIN, TIBC, IRON, RETICCTPCT in the last 72 hours. Microbiology Recent Results (from the past 240 hour(s))  Resp Panel by RT-PCR (Flu A&B, Covid) Nasopharyngeal Swab     Status: Abnormal   Collection Time: 03/05/21  1:41 PM   Specimen: Nasopharyngeal Swab; Nasopharyngeal(NP) swabs in vial transport medium  Result Value Ref Range Status   SARS Coronavirus 2 by RT PCR POSITIVE (A) NEGATIVE Final    Comment: RESULT CALLED TO, READ BACK BY AND VERIFIED WITH: Etta Quill @ [id number] 03/05/2021 PERRY,J. (NOTE) SARS-CoV-2 target nucleic acids are DETECTED.  The SARS-CoV-2 RNA is generally detectable in upper respiratory specimens during the acute phase of infection. Positive results  are indicative of the presence of the identified virus, but do not rule out bacterial infection or co-infection with other pathogens not detected by the test. Clinical correlation with patient history and other diagnostic information is necessary to determine patient infection status. The expected result is Negative.  Fact Sheet for Patients: EntrepreneurPulse.com.au  Fact Sheet for Healthcare Providers: IncredibleEmployment.be  This test is not yet approved or cleared by the [place] FDA and  has been authorized for detection and/or diagnosis of SARS-CoV-2 by FDA under an Emergency Use Authorization (EUA).  This EUA will remain in effect (meaning this tes t can be used) for the duration of  the COVID-19 declaration under Section 564(b)(1) of the Act, 21 U.S.C. section 360bbb-3(b)(1), unless the authorization is terminated or revoked sooner.     Influenza A by PCR NEGATIVE NEGATIVE Final   Influenza B by PCR NEGATIVE NEGATIVE  Final    Comment: (NOTE) The Xpert Xpress SARS-CoV-2/FLU/RSV plus assay is intended as an aid in the diagnosis of influenza from Nasopharyngeal swab specimens and should not be used as a sole basis for treatment. Nasal washings and aspirates are unacceptable for Xpert Xpress SARS-CoV-2/FLU/RSV testing.  Fact Sheet for Patients: EntrepreneurPulse.com.au  Fact Sheet for Healthcare Providers: IncredibleEmployment.be  This test is not yet approved or cleared by the [place] FDA and has been authorized for detection and/or diagnosis of SARS-CoV-2 by FDA under an Emergency Use Authorization (EUA). This EUA will remain in effect (meaning this test can be used) for the duration of the COVID-19 declaration under Section 564(b)(1) of the Act, 21 U.S.C. section 360bbb-3(b)(1), unless the authorization is terminated or revoked.  Performed at [facility], [address] [place], [place] [address]   Culture, blood (Routine X 2) w Reflex to ID Panel     Status: None (Preliminary result)   Collection Time: 03/05/21  7:28 PM   Specimen: BLOOD  Result Value Ref Range Status   Specimen Description   Final    BLOOD LEFT ANTECUBITAL Performed at [facility] [address], [place], [place] [address]    Special Requests   Final    BOTTLES DRAWN AEROBIC ONLY Blood Culture adequate volume Performed at [facility] [address], [place], [place] [address]    Culture   Final    NO GROWTH 2 DAYS Performed at [facility] [address], [place], [place] [address]    Report Status PENDING  Incomplete     Discharge Instructions:   Discharge Instructions     Diet - low sodium heart healthy   Complete by: As directed    Discharge instructions   Complete by: As directed    ?   Person Under Monitoring Name: Cheree Fowles  Location: [place] [address]   Infection Prevention Recommendations for Individuals Confirmed to have, or Being Evaluated for, 2019 Novel Coronavirus (COVID-19) Infection Who Receive Care at Home  Individuals who are confirmed to have, or are being evaluated for, COVID-19 should follow the prevention steps below until a healthcare provider or local or state health department says they can return to normal activities.  Continue isolation through 03/15/2021  Stay home except to get medical care You should restrict activities outside your home, except for getting medical care. Do not go to work, school, or public areas, and do not use public transportation or taxis.  Call ahead before visiting your doctor Before your medical appointment, call the healthcare provider and tell them that you have, or are being evaluated for, COVID-19 infection. This will help the healthcare provider's office take steps to keep other people from getting infected. Ask  your healthcare provider to call the local or state health department.  Monitor your symptoms Seek prompt medical attention if your illness is worsening (e.g., difficulty breathing). Before going to your medical appointment, call the healthcare provider and tell them that you have, or are being evaluated for, COVID-19 infection. Ask your healthcare provider to call the local or state health department.  Wear a facemask You should wear a facemask that covers your nose and mouth when you are in the same room with other people and when you visit a healthcare provider. People who live with or visit you should also wear a facemask while they are in the same room with you.  Separate yourself from other  people in your home As much as possible, you should stay in a different room from other people in your home. Also, you should use a separate bathroom, if available.  Avoid sharing household items You should not share dishes, drinking glasses, cups, eating utensils, towels, bedding, or other items with other people in your home. After using these items, you should wash them thoroughly with soap and water.  Cover your coughs and sneezes Cover your mouth and nose with a tissue when you cough or sneeze, or you can cough or sneeze into your sleeve. Throw used tissues in a lined trash can, and immediately wash your hands with soap and water for at least 20 seconds or use an alcohol-based hand rub.  Wash your [facility] your hands often and thoroughly with soap and water for at least 20 seconds. You can use an alcohol-based hand sanitizer if soap and water are not available and if your hands are not visibly dirty. Avoid touching your eyes, nose, and mouth with unwashed hands.   Prevention Steps for Caregivers and Household Members of Individuals Confirmed to have, or Being Evaluated for, COVID-19 Infection Being Cared for in the Home  If you live with, or provide care at home for, a person  confirmed to have, or being evaluated for, COVID-19 infection please follow these guidelines to prevent infection:  Follow healthcare provider's instructions Make sure that you understand and can help the patient follow any healthcare provider instructions for all care.  Provide for the patient's basic needs You should help the patient with basic needs in the home and provide support for getting groceries, prescriptions, and other personal needs.  Monitor the patient's symptoms If they are getting sicker, call his or her medical provider and tell them that the patient has, or is being evaluated for, COVID-19 infection. This will help the healthcare provider's office take steps to keep other people from getting infected. Ask the healthcare provider to call the local or state health department.  Limit the number of people who have contact with the patient If possible, have only one caregiver for the patient. Other household members should stay in another home or place of residence. If this is not possible, they should stay in another room, or be separated from the patient as much as possible. Use a separate bathroom, if available. Restrict visitors who do not have an essential need to be in the home.  Keep older adults, very young children, and other sick people away from the patient Keep older adults, very young children, and those who have compromised immune systems or chronic health conditions away from the patient. This includes people with chronic heart, lung, or kidney conditions, diabetes, and cancer.  Ensure good ventilation Make sure that shared spaces in the home have good air flow, such as from an air conditioner or an opened window, weather permitting.  Wash your hands often Wash your hands often and thoroughly with soap and water for at least 20 seconds. You can use an alcohol based hand sanitizer if soap and water are not available and if your hands are not visibly  dirty. Avoid touching your eyes, nose, and mouth with unwashed hands. Use disposable paper towels to dry your hands. If not available, use dedicated cloth towels and replace them when they become wet.  Wear a facemask and gloves Wear a disposable facemask at all times in the room and gloves when you touch or have contact with the patient's blood, body fluids, and/or  secretions or excretions, such as sweat, saliva, sputum, nasal mucus, vomit, urine, or feces.  Ensure the mask fits over your nose and mouth tightly, and do not touch it during use. Throw out disposable facemasks and gloves after using them. Do not reuse. Wash your hands immediately after removing your facemask and gloves. If your personal clothing becomes contaminated, carefully remove clothing and launder. Wash your hands after handling contaminated clothing. Place all used disposable facemasks, gloves, and other waste in a lined container before disposing them with other household waste. Remove gloves and wash your hands immediately after handling these items.  Do not share dishes, glasses, or other household items with the patient Avoid sharing household items. You should not share dishes, drinking glasses, cups, eating utensils, towels, bedding, or other items with a patient who is confirmed to have, or being evaluated for, COVID-19 infection. After the person uses these items, you should wash them thoroughly with soap and water.  Wash laundry thoroughly Immediately remove and wash clothes or bedding that have blood, body fluids, and/or secretions or excretions, such as sweat, saliva, sputum, nasal mucus, vomit, urine, or feces, on them. Wear gloves when handling laundry from the patient. Read and follow directions on labels of laundry or clothing items and detergent. In general, wash and dry with the warmest temperatures recommended on the label.  Clean all areas the individual has used often Clean all touchable surfaces, such  as counters, tabletops, doorknobs, bathroom fixtures, toilets, phones, keyboards, tablets, and bedside tables, every day. Also, clean any surfaces that may have blood, body fluids, and/or secretions or excretions on them. Wear gloves when cleaning surfaces the patient has come in contact with. Use a diluted bleach solution (e.g., dilute bleach with 1 part bleach and 10 parts water) or a household disinfectant with a label that says EPA-registered for coronaviruses. To make a bleach solution at home, add 1 tablespoon of bleach to 1 quart (4 cups) of water. For a larger supply, add  cup of bleach to 1 gallon (16 cups) of water. Read labels of cleaning products and follow recommendations provided on product labels. Labels contain instructions for safe and effective use of the cleaning product including precautions you should take when applying the product, such as wearing gloves or eye protection and making sure you have good ventilation during use of the product. Remove gloves and wash hands immediately after cleaning.  Monitor yourself for signs and symptoms of illness Caregivers and household members are considered close contacts, should monitor their health, and will be asked to limit movement outside of the home to the extent possible. Follow the monitoring steps for close contacts listed on the symptom monitoring form.   ? If you have additional questions, contact your local health department or call the epidemiologist on call at [phone] (available 24/7). ? This guidance is subject to change. For the most up-to-date guidance from [facility], please refer to their website: YouBlogs.pl   Increase activity slowly   Complete by: As directed       Allergies as of 03/09/2021       Reactions   Codeine Other (See Comments)   "Knocks her out"        Medication List     STOP taking these medications    estradiol 0.1 MG/GM  vaginal cream Commonly known as: ESTRACE   mirabegron ER 25 MG Tb24 tablet Commonly known as: MYRBETRIQ       TAKE these medications    azelastine 0.1 %  nasal spray Commonly known as: ASTELIN Place 1 spray into both nostrils 2 (two) times daily as needed for rhinitis or allergies.   butalbital-aspirin-caffeine 50-325-40 MG capsule Commonly known as: FIORINAL TAKE 1 CAPSULE BY MOUTH EVERY 4 HOURS AS NEEDED FOR HEADACHE What changed: See the new instructions.   FLUoxetine 20 MG capsule Commonly known as: PROZAC TAKE 1 CAPSULE(20 MG) BY MOUTH DAILY What changed: See the new instructions.   levothyroxine 75 MCG tablet Commonly known as: SYNTHROID Take 1 tablet (75 mcg total) by mouth daily.   omeprazole 20 MG capsule Commonly known as: PRILOSEC Take 1 capsule (20 mg total) by mouth daily as needed (for reflux symptoms).   zolpidem 10 MG tablet Commonly known as: AMBIEN TAKE 1 TABLET BY MOUTH EVERY DAY AT BEDTIME           If you experience worsening of your admission symptoms, develop shortness of breath, life threatening emergency, suicidal or homicidal thoughts you must seek medical attention immediately by calling 911 or calling your MD immediately  if symptoms less severe.   You must read complete instructions/literature along with all the possible adverse reactions/side effects for all the medicines you take and that have been prescribed to you. Take any new medicines after you have completely understood and accept all the possible adverse reactions/side effects.    Please note   You were cared for by a hospitalist during your hospital stay. If you have any questions about your discharge medications or the care you received while you were in the hospital after you are discharged, you can call the unit and asked to speak with the hospitalist on call if the hospitalist that took care of you is not available. Once you are discharged, your primary care physician will  handle any further medical issues. Please note that NO REFILLS for any discharge medications will be authorized once you are discharged, as it is imperative that you return to your primary care physician (or establish a relationship with a primary care physician if you do not have one) for your aftercare needs so that they can reassess your need for medications and monitor your lab values.       Time coordinating discharge: 31 minutes  Signed:  Qadir Folks  [facility] 03/09/2021, 11:04 AM   Pager on [email or web address]. If 7PM-7AM, please contact night-coverage at [email or web address]

## 2021-03-10 ENCOUNTER — Telehealth: Payer: Self-pay

## 2021-03-10 NOTE — Telephone Encounter (Cosign Needed)
Transition Care Management Unsuccessful Follow-up Telephone Call  Date of discharge and from where:  [facility] 03/09/21  Attempts:  1st Attempt  Reason for unsuccessful TCM follow-up call:  Left voice message

## 2021-03-11 LAB — CULTURE, BLOOD (ROUTINE X 2)
Culture: NO GROWTH
Culture: NO GROWTH
Special Requests: ADEQUATE

## 2022-02-20 ENCOUNTER — Other Ambulatory Visit: Payer: Self-pay | Admitting: Family Medicine

## 2023-02-21 ENCOUNTER — Other Ambulatory Visit: Payer: Self-pay | Admitting: Family Medicine

## 2023-04-30 ENCOUNTER — Other Ambulatory Visit: Payer: Self-pay | Admitting: Family Medicine

## 2023-05-02 ENCOUNTER — Other Ambulatory Visit: Payer: Self-pay

## 2023-05-02 ENCOUNTER — Emergency Department (HOSPITAL_COMMUNITY)
Admission: EM | Admit: 2023-05-02 | Discharge: 2023-05-02 | Disposition: A | Discharge status: Home / Self Care | Payer: Medicare Other | Attending: Emergency Medicine | Admitting: Emergency Medicine

## 2023-05-02 ENCOUNTER — Emergency Department (HOSPITAL_COMMUNITY): Payer: Medicare Other

## 2023-05-02 ENCOUNTER — Encounter (HOSPITAL_COMMUNITY): Payer: Self-pay | Admitting: Emergency Medicine

## 2023-05-02 DIAGNOSIS — U071 COVID-19: Secondary | ICD-10-CM | POA: Insufficient documentation

## 2023-05-02 DIAGNOSIS — R059 Cough, unspecified: Secondary | ICD-10-CM | POA: Diagnosis present

## 2023-05-02 DIAGNOSIS — R000 Tachycardia, unspecified: Secondary | ICD-10-CM | POA: Insufficient documentation

## 2023-05-02 LAB — URINALYSIS, W/ REFLEX TO CULTURE (INFECTION SUSPECTED)
Bacteria, UA: NONE SEEN
Bilirubin Urine: NEGATIVE
Glucose, UA: NEGATIVE mg/dL
Hgb urine dipstick: NEGATIVE
Ketones, ur: 5 mg/dL — AB
Leukocytes,Ua: NEGATIVE
Nitrite: NEGATIVE
Protein, ur: NEGATIVE mg/dL
Specific Gravity, Urine: 1.012 (ref 1.005–1.030)
pH: 7 (ref 5.0–8.0)

## 2023-05-02 LAB — COMPREHENSIVE METABOLIC PANEL
ALT: 11 U/L (ref 0–44)
AST: 18 U/L (ref 15–41)
Albumin: 3.7 g/dL (ref 3.5–5.0)
Alkaline Phosphatase: 110 U/L (ref 38–126)
Anion gap: 9 (ref 5–15)
BUN: 9 mg/dL (ref 8–23)
CO2: 23 mmol/L (ref 22–32)
Calcium: 8.3 mg/dL — ABNORMAL LOW (ref 8.9–10.3)
Chloride: 102 mmol/L (ref 98–111)
Creatinine, Ser: 0.62 mg/dL (ref 0.44–1.00)
GFR, Estimated: >60 mL/min (ref >60)
Glucose, Bld: 145 mg/dL — ABNORMAL HIGH (ref 70–99)
Potassium: 3.3 mmol/L — ABNORMAL LOW (ref 3.5–5.1)
Sodium: 134 mmol/L — ABNORMAL LOW (ref 135–145)
Total Bilirubin: 0.6 mg/dL (ref <1.2)
Total Protein: 6.8 g/dL (ref 6.5–8.1)

## 2023-05-02 LAB — CBC WITH DIFFERENTIAL/PLATELET
Abs Immature Granulocytes: 0.03 10*3/uL (ref 0.00–0.07)
Basophils Absolute: 0 10*3/uL (ref 0.0–0.1)
Basophils Relative: 0 %
Eosinophils Absolute: 0 10*3/uL (ref 0.0–0.5)
Eosinophils Relative: 0 %
HCT: 39.2 % (ref 36.0–46.0)
Hemoglobin: 13.2 g/dL (ref 12.0–15.0)
Immature Granulocytes: 0 %
Lymphocytes Relative: 9 %
Lymphs Abs: 0.6 10*3/uL — ABNORMAL LOW (ref 0.7–4.0)
MCH: 30.6 pg (ref 26.0–34.0)
MCHC: 33.7 g/dL (ref 30.0–36.0)
MCV: 90.7 fL (ref 80.0–100.0)
Monocytes Absolute: 0.8 10*3/uL (ref 0.1–1.0)
Monocytes Relative: 13 %
Neutro Abs: 5.2 10*3/uL (ref 1.7–7.7)
Neutrophils Relative %: 78 %
Platelets: 182 10*3/uL (ref 150–400)
RBC: 4.32 MIL/uL (ref 3.87–5.11)
RDW: 13.5 % (ref 11.5–15.5)
WBC: 6.7 10*3/uL (ref 4.0–10.5)
nRBC: 0 % (ref 0.0–0.2)

## 2023-05-02 LAB — RESP PANEL BY RT-PCR (RSV, FLU A&B, COVID)  RVPGX2
Influenza A by PCR: NEGATIVE
Influenza B by PCR: NEGATIVE
Resp Syncytial Virus by PCR: NEGATIVE
SARS Coronavirus 2 by RT PCR: POSITIVE — AB

## 2023-05-02 LAB — I-STAT CG4 LACTIC ACID, ED
Lactic Acid, Venous: 0.8 mmol/L (ref 0.5–1.9)
Lactic Acid, Venous: 0.6 mmol/L (ref 0.5–1.9)

## 2023-05-02 LAB — PROTIME-INR
INR: 1.1 (ref 0.8–1.2)
Prothrombin Time: 14.2 s (ref 11.4–15.2)

## 2023-05-02 LAB — APTT: aPTT: 28 s (ref 24–36)

## 2023-05-02 MED ORDER — PAXLOVID (300/100) 20 X 150 MG & 10 X 100MG PO TBPK: 3.0000 | ORAL_TABLET | Freq: Two times a day (BID) | ORAL | 0 refills | Status: AC

## 2023-05-02 NOTE — ED Notes (Signed)
RN called Jeanette Butler to inform facility that patient is returning RN was told to call family and make them aware. RN s/w Kym Bean patient daughter and updated her on pt status. [facility] called to have patient transported back to [place].

## 2023-05-02 NOTE — ED Triage Notes (Signed)
Pt BIBA from [place] c/o feeling generally weak and unwell, endorses flu like symptoms since 1900 last night. Endorses polyuria x1day. Pt is diaphoretic, lethargic on presentation.  EMS placed 20g L hand 4mg  zofran, 450cc NS, 650mg  tylenol given enroute.

## 2023-05-02 NOTE — ED Provider Notes (Signed)
[place] EMERGENCY DEPARTMENT AT [facility] Provider Note   CSN: [id number] Arrival date & time: 05/02/23  0522     History  Chief Complaint  Patient presents with   Weakness    Jeanette Butler is a 89 y.o. female.  Brought to the emergency department by ambulance.  Patient reports that she started feeling "achy" at around 6 PM.  Patient with generalized malaise, aching all over.  She has developed a cough.  She now has some nausea and vomiting.  Patient reports that she was recently with somebody who was ill and thinks she contracted the illness.       Home Medications Prior to Admission medications   Medication Sig Start Date End Date Taking? Authorizing Provider  nirmatrelvir/ritonavir (PAXLOVID, 300/100,) 20 x 150 MG & 10 x 100MG  TBPK Take 3 tablets by mouth 2 (two) times daily for 5 days. Patient GFR is >60. Take nirmatrelvir (150 mg) two tablets twice daily for 5 days and ritonavir (100 mg) one tablet twice daily for 5 days. 05/02/23 05/07/23 Yes Irem Stoneham, Canary Brim, MD  azelastine (ASTELIN) 0.1 % nasal spray Place 1 spray into both nostrils 2 (two) times daily as needed for rhinitis or allergies.    [provider]  butalbital-aspirin-caffeine (FIORINAL) 50-325-40 MG capsule TAKE 1 CAPSULE BY MOUTH EVERY 4 HOURS AS NEEDED FOR HEADACHE Patient taking differently: Take 1 capsule by mouth every 4 (four) hours as needed for headache or migraine. 02/06/19   Ardith Dark, MD  FLUoxetine (PROZAC) 20 MG capsule TAKE 1 CAPSULE(20 MG) BY MOUTH DAILY 02/20/22   Ardith Dark, MD  levothyroxine (SYNTHROID) 75 MCG tablet Take 1 tablet (75 mcg total) by mouth daily. 07/17/19   Ardith Dark, MD  omeprazole (PRILOSEC) 20 MG capsule Take 1 capsule (20 mg total) by mouth daily as needed (for reflux symptoms). 03/09/21   Lurene Shadow, MD  zolpidem (AMBIEN) 10 MG tablet TAKE 1 TABLET BY MOUTH EVERY DAY AT BEDTIME Patient taking differently: Take 10 mg by mouth at  bedtime. 05/03/20   Ardith Dark, MD      Allergies    Codeine    Review of Systems   Review of Systems  Physical Exam Updated Vital Signs BP (!) 166/109   Pulse 96   Temp 99 F (37.2 C) (Oral)   Resp (!) 25   Ht 5\' 5"  (1.651 m)   Wt 67.1 kg   SpO2 94%   BMI 24.63 kg/m  Physical Exam Vitals and nursing note reviewed.  Constitutional:      General: She is not in acute distress.    Appearance: She is well-developed.  HENT:     Head: Normocephalic and atraumatic.     Mouth/Throat:     Mouth: Mucous membranes are moist.  Eyes:     General: Vision grossly intact. Gaze aligned appropriately.     Extraocular Movements: Extraocular movements intact.     Conjunctiva/sclera: Conjunctivae normal.  Cardiovascular:     Rate and Rhythm: Normal rate and regular rhythm.     Pulses: Normal pulses.     Heart sounds: Normal heart sounds, S1 normal and S2 normal. No murmur heard.    No friction rub. No gallop.  Pulmonary:     Effort: Pulmonary effort is normal. No respiratory distress.     Breath sounds: Normal breath sounds.  Abdominal:     General: Bowel sounds are normal.     Palpations: Abdomen is soft.  Tenderness: There is no abdominal tenderness. There is no guarding or rebound.     Hernia: No hernia is present.  Musculoskeletal:        General: No swelling.     Cervical back: Full passive range of motion without pain, normal range of motion and neck supple. No spinous process tenderness or muscular tenderness. Normal range of motion.     Right lower leg: No edema.     Left lower leg: No edema.  Skin:    General: Skin is warm and dry.     Capillary Refill: Capillary refill takes less than 2 seconds.     Findings: No ecchymosis, erythema, rash or wound.  Neurological:     General: No focal deficit present.     Mental Status: She is alert and oriented to person, place, and time.     GCS: GCS eye subscore is 4. GCS verbal subscore is 5. GCS motor subscore is 6.      Cranial Nerves: Cranial nerves 2-12 are intact.     Sensory: Sensation is intact.     Motor: Motor function is intact.     Coordination: Coordination is intact.  Psychiatric:        Attention and Perception: Attention normal.        Mood and Affect: Mood normal.        Speech: Speech normal.        Behavior: Behavior normal.     ED Results / Procedures / Treatments   Labs (all labs ordered are listed, but only abnormal results are displayed) Labs Reviewed  RESP PANEL BY RT-PCR (RSV, FLU A&B, COVID)  RVPGX2 - Abnormal; Notable for the following components:      Result Value   SARS Coronavirus 2 by RT PCR POSITIVE (*)    All other components within normal limits  COMPREHENSIVE METABOLIC PANEL - Abnormal; Notable for the following components:   Sodium 134 (*)    Potassium 3.3 (*)    Glucose, Bld 145 (*)    Calcium 8.3 (*)    All other components within normal limits  CBC WITH DIFFERENTIAL/PLATELET - Abnormal; Notable for the following components:   Lymphs Abs 0.6 (*)    All other components within normal limits  URINALYSIS, W/ REFLEX TO CULTURE (INFECTION SUSPECTED) - Abnormal; Notable for the following components:   Color, Urine STRAW (*)    Ketones, ur 5 (*)    All other components within normal limits  CULTURE, BLOOD (ROUTINE X 2)  CULTURE, BLOOD (ROUTINE X 2)  PROTIME-INR  APTT  I-STAT CG4 LACTIC ACID, ED  I-STAT CG4 LACTIC ACID, ED    EKG EKG Interpretation Date/Time:  Thursday May 02 2023 05:46:31 EST Ventricular Rate:  101 PR Interval:  184 QRS Duration:  104 QT Interval:  371 QTC Calculation: 481 R Axis:   63  Text Interpretation: Sinus tachycardia Confirmed by Gilda Crease ([id number]) on 05/02/2023 7:24:24 AM  Radiology DG Chest Port 1 View Result Date: 05/02/2023 CLINICAL DATA:  89 year old female with history of weakness and lethargy. EXAM: PORTABLE CHEST 1 VIEW COMPARISON:  Chest x-ray 03/05/2021. FINDINGS: Lung volumes are low. No  consolidative airspace disease. Mild diffuse interstitial prominence and widespread peribronchial cuffing, less pronounced than the prior study, likely reflecting a background of chronic bronchitis. No pleural effusions. No pneumothorax. No pulmonary nodule or mass noted. Pulmonary vasculature and the cardiomediastinal silhouette are within normal limits. IMPRESSION: 1. Low lung volumes without radiographic evidence of acute cardiopulmonary disease.  2. Appearance of the lungs suggests probable chronic bronchitis. Electronically Signed   By: Trudie Reed M.D.   On: 05/02/2023 06:01    Procedures Procedures    Medications Ordered in ED Medications - No data to display  ED Course/ Medical Decision Making/ A&P                                 Medical Decision Making Amount and/or Complexity of Data Reviewed Labs: ordered. Radiology: ordered. ECG/medicine tests: ordered.  Risk Prescription drug management.   Differential Diagnosis considered includes, but not limited to: COVID-19; influenza; RSV; simple viral URI; pneumonia   Presents to the emergency department for evaluation of generalized malaise, some cough.  She reports that she recently was exposed to somebody with URI symptoms.  Chest x-ray at arrival does not show any evidence of pneumonia.  Urinalysis without signs of infection.  Blood work is entirely normal.  She arrived on oxygen which was started by EMS, presumably for comfort.  I have removed her oxygen and her sats are 94 to 95% on room air.  COVID test is positive.        Final Clinical Impression(s) / ED Diagnoses Final diagnoses:  COVID-19    Rx / DC Orders ED Discharge Orders          Ordered    nirmatrelvir/ritonavir (PAXLOVID, 300/100,) 20 x 150 MG & 10 x 100MG  TBPK  2 times daily        05/02/23 0747              Gilda Crease, MD 05/02/23 [id number]

## 2023-05-03 ENCOUNTER — Other Ambulatory Visit: Payer: Self-pay

## 2023-05-03 ENCOUNTER — Emergency Department (HOSPITAL_COMMUNITY): Payer: Medicare Other

## 2023-05-03 ENCOUNTER — Emergency Department (HOSPITAL_COMMUNITY)
Admission: EM | Admit: 2023-05-03 | Discharge: 2023-05-03 | Disposition: A | Discharge status: Home / Self Care | Payer: Medicare Other | Attending: Emergency Medicine | Admitting: Emergency Medicine

## 2023-05-03 DIAGNOSIS — W1830XA Fall on same level, unspecified, initial encounter: Secondary | ICD-10-CM | POA: Diagnosis not present

## 2023-05-03 DIAGNOSIS — M25551 Pain in right hip: Secondary | ICD-10-CM | POA: Diagnosis present

## 2023-05-03 DIAGNOSIS — U071 COVID-19: Secondary | ICD-10-CM | POA: Insufficient documentation

## 2023-05-03 DIAGNOSIS — W19XXXA Unspecified fall, initial encounter: Secondary | ICD-10-CM

## 2023-05-03 LAB — BASIC METABOLIC PANEL
Anion gap: 9 (ref 5–15)
BUN: 7 mg/dL — ABNORMAL LOW (ref 8–23)
CO2: 26 mmol/L (ref 22–32)
Calcium: 8.9 mg/dL (ref 8.9–10.3)
Chloride: 100 mmol/L (ref 98–111)
Creatinine, Ser: 0.58 mg/dL (ref 0.44–1.00)
GFR, Estimated: >60 mL/min (ref >60)
Glucose, Bld: 112 mg/dL — ABNORMAL HIGH (ref 70–99)
Potassium: 3.3 mmol/L — ABNORMAL LOW (ref 3.5–5.1)
Sodium: 135 mmol/L (ref 135–145)

## 2023-05-03 LAB — CBC WITH DIFFERENTIAL/PLATELET
Abs Immature Granulocytes: 0.04 10*3/uL (ref 0.00–0.07)
Basophils Absolute: 0 10*3/uL (ref 0.0–0.1)
Basophils Relative: 1 %
Eosinophils Absolute: 0 10*3/uL (ref 0.0–0.5)
Eosinophils Relative: 0 %
HCT: 40.7 % (ref 36.0–46.0)
Hemoglobin: 13.7 g/dL (ref 12.0–15.0)
Immature Granulocytes: 1 %
Lymphocytes Relative: 19 %
Lymphs Abs: 1.1 10*3/uL (ref 0.7–4.0)
MCH: 29.8 pg (ref 26.0–34.0)
MCHC: 33.7 g/dL (ref 30.0–36.0)
MCV: 88.7 fL (ref 80.0–100.0)
Monocytes Absolute: 1 10*3/uL (ref 0.1–1.0)
Monocytes Relative: 17 %
Neutro Abs: 3.8 10*3/uL (ref 1.7–7.7)
Neutrophils Relative %: 62 %
Platelets: 196 10*3/uL (ref 150–400)
RBC: 4.59 MIL/uL (ref 3.87–5.11)
RDW: 13.3 % (ref 11.5–15.5)
WBC: 6 10*3/uL (ref 4.0–10.5)
nRBC: 0 % (ref 0.0–0.2)

## 2023-05-03 LAB — URINALYSIS, ROUTINE W REFLEX MICROSCOPIC
Bilirubin Urine: NEGATIVE
Glucose, UA: NEGATIVE mg/dL
Hgb urine dipstick: NEGATIVE
Ketones, ur: 20 mg/dL — AB
Leukocytes,Ua: NEGATIVE
Nitrite: NEGATIVE
Protein, ur: 30 mg/dL — AB
Specific Gravity, Urine: 1.011 (ref 1.005–1.030)
pH: 6 (ref 5.0–8.0)

## 2023-05-03 LAB — CK: Total CK: 66 U/L (ref 38–234)

## 2023-05-03 MED ORDER — ONDANSETRON HCL 4 MG PO TABS: 4.0000 mg | ORAL_TABLET | Freq: Four times a day (QID) | ORAL | 0 refills | Status: AC

## 2023-05-03 MED ORDER — GUAIFENESIN ER 600 MG PO TB12: 600.0000 mg | ORAL_TABLET | Freq: Two times a day (BID) | ORAL | 0 refills | Status: DC

## 2023-05-03 NOTE — Discharge Instructions (Addendum)
You have been seen and discharged from the emergency department.  Your blood work and urine were normal.  The x-ray showed no fracture.  Continue to take Paxlovid.  You have been prescribed nausea medicine and cough medicine.  Follow-up with your primary provider for further evaluation and further care. Take home medications as prescribed. If you have any worsening symptoms or further concerns for your health please return to an emergency department for further evaluation.

## 2023-05-03 NOTE — ED Triage Notes (Signed)
Patient BIB EMS from [facility] for fall, patient found laying about an hour ago unknown how long patient was on floor. Denies hitting head or blood thinners. Right hip pain. Covid +. Patient able to stand on BLE without pain. Confusion at baseline. 130/100, 85, 16, 93%ra.

## 2023-05-03 NOTE — ED Notes (Signed)
[facility] called, 12th in line

## 2023-05-03 NOTE — ED Provider Notes (Signed)
[place] EMERGENCY DEPARTMENT AT [facility] Provider Note   CSN: [id number] Arrival date & time: 05/03/23  1020     History  Chief Complaint  Patient presents with   Krysteena Assad is a 89 y.o. female.  HPI   89 year old female presents emergency department after mechanical fall and being unable to get off the ground.  Was seen here yesterday for generalized complaint, found to be COVID-positive.  Was discharged home.  States that she felt generally weak since getting home.  About 3 AM this morning she got up to use the restroom.  States that she had a mechanical fall down onto her right buttocks.  Did not hit her head, no loss of consciousness.  Was able to crawl back to her bed but was unable to reach her phone so she spent the night on the floor.  In the morning she was able to crawl to her phone, called her granddaughter who found her on the ground and called EMS.  Home Medications Prior to Admission medications   Medication Sig Start Date End Date Taking? Authorizing Provider  azelastine (ASTELIN) 0.1 % nasal spray Place 1 spray into both nostrils 2 (two) times daily as needed for rhinitis or allergies.    [provider]  butalbital-aspirin-caffeine (FIORINAL) 50-325-40 MG capsule TAKE 1 CAPSULE BY MOUTH EVERY 4 HOURS AS NEEDED FOR HEADACHE Patient taking differently: Take 1 capsule by mouth every 4 (four) hours as needed for headache or migraine. 02/06/19   Ardith Dark, MD  FLUoxetine (PROZAC) 20 MG capsule TAKE 1 CAPSULE(20 MG) BY MOUTH DAILY 02/20/22   Ardith Dark, MD  levothyroxine (SYNTHROID) 75 MCG tablet Take 1 tablet (75 mcg total) by mouth daily. 07/17/19   Ardith Dark, MD  nirmatrelvir/ritonavir (PAXLOVID, 300/100,) 20 x 150 MG & 10 x 100MG  TBPK Take 3 tablets by mouth 2 (two) times daily for 5 days. Patient GFR is >60. Take nirmatrelvir (150 mg) two tablets twice daily for 5 days and ritonavir (100 mg) one tablet twice daily for 5 days.  05/02/23 05/07/23  Gilda Crease, MD  omeprazole (PRILOSEC) 20 MG capsule Take 1 capsule (20 mg total) by mouth daily as needed (for reflux symptoms). 03/09/21   Lurene Shadow, MD  zolpidem (AMBIEN) 10 MG tablet TAKE 1 TABLET BY MOUTH EVERY DAY AT BEDTIME Patient taking differently: Take 10 mg by mouth at bedtime. 05/03/20   Ardith Dark, MD      Allergies    Codeine    Review of Systems   Review of Systems  Constitutional:  Positive for fatigue. Negative for fever.  Respiratory:  Negative for shortness of breath.   Cardiovascular:  Negative for chest pain.  Gastrointestinal:  Negative for abdominal pain, diarrhea and vomiting.  Musculoskeletal:  Positive for myalgias.       + Right hip pain  Skin:  Negative for rash.  Neurological:  Negative for headaches.    Physical Exam Updated Vital Signs BP (!) 172/97 (BP Location: Right Arm)   Pulse 70   Temp 98.2 F (36.8 C) (Oral)   Resp 16   SpO2 100%  Physical Exam Vitals and nursing note reviewed.  Constitutional:      General: She is not in acute distress.    Appearance: Normal appearance.  HENT:     Head: Normocephalic and atraumatic.     Mouth/Throat:     Mouth: Mucous membranes are moist.  Cardiovascular:  Rate and Rhythm: Normal rate.  Pulmonary:     Effort: Pulmonary effort is normal. No respiratory distress.  Abdominal:     Palpations: Abdomen is soft.     Tenderness: There is no abdominal tenderness.  Musculoskeletal:     Cervical back: No rigidity or tenderness.     Comments: TTP right hip, pelvis stable  Skin:    General: Skin is warm.  Neurological:     Mental Status: She is alert and oriented to person, place, and time. Mental status is at baseline.  Psychiatric:        Mood and Affect: Mood normal.     ED Results / Procedures / Treatments   Labs (all labs ordered are listed, but only abnormal results are displayed) Labs Reviewed  CBC WITH DIFFERENTIAL/PLATELET  BASIC METABOLIC  PANEL  CK  URINALYSIS, ROUTINE W REFLEX MICROSCOPIC    EKG None  Radiology DG Chest Port 1 View Result Date: 05/02/2023 CLINICAL DATA:  89 year old female with history of weakness and lethargy. EXAM: PORTABLE CHEST 1 VIEW COMPARISON:  Chest x-ray 03/05/2021. FINDINGS: Lung volumes are low. No consolidative airspace disease. Mild diffuse interstitial prominence and widespread peribronchial cuffing, less pronounced than the prior study, likely reflecting a background of chronic bronchitis. No pleural effusions. No pneumothorax. No pulmonary nodule or mass noted. Pulmonary vasculature and the cardiomediastinal silhouette are within normal limits. IMPRESSION: 1. Low lung volumes without radiographic evidence of acute cardiopulmonary disease. 2. Appearance of the lungs suggests probable chronic bronchitis. Electronically Signed   By: Trudie Reed M.D.   On: 05/02/2023 06:01    Procedures Procedures    Medications Ordered in ED Medications - No data to display  ED Course/ Medical Decision Making/ A&P                                 Medical Decision Making Amount and/or Complexity of Data Reviewed Labs: ordered. Radiology: ordered.  Risk OTC drugs. Prescription drug management.   89 year old female who is COVID-positive comes for a fall overnight after trying to use the bathroom without her walker.  Complaining of right hip pain.  Otherwise continues to feel generally weak from her COVID diagnosis, taking Paxil bid.  She has a mild tenderness to palpation of the right hip, x-rays negative.  Blood work is normal, urinalysis is negative.  Patient wants to eat and drink and attempt ambulation.  She is hoping to go home and at this time does not want admission/rehab.  Patient signed out pending reevaluation.        Final Clinical Impression(s) / ED Diagnoses Final diagnoses:  None    Rx / DC Orders ED Discharge Orders     None         Rozelle Logan,  DO 05/03/23 1603

## 2023-05-07 ENCOUNTER — Other Ambulatory Visit: Payer: Self-pay | Admitting: Family Medicine

## 2023-05-07 LAB — CULTURE, BLOOD (ROUTINE X 2)
Culture: NO GROWTH
Culture: NO GROWTH

## 2024-02-20 ENCOUNTER — Other Ambulatory Visit: Payer: Self-pay

## 2024-02-20 ENCOUNTER — Ambulatory Visit: Payer: Self-pay | Admitting: General Practice

## 2024-02-20 ENCOUNTER — Encounter (HOSPITAL_COMMUNITY): Payer: Self-pay | Admitting: Emergency Medicine

## 2024-02-20 ENCOUNTER — Emergency Department (HOSPITAL_COMMUNITY): Admission: EM | Admit: 2024-02-20 | Discharge: 2024-02-20 | Disposition: A | Discharge status: Home / Self Care

## 2024-02-20 ENCOUNTER — Emergency Department (HOSPITAL_COMMUNITY)

## 2024-02-20 ENCOUNTER — Encounter (HOSPITAL_COMMUNITY): Payer: Self-pay

## 2024-02-20 ENCOUNTER — Ambulatory Visit (HOSPITAL_COMMUNITY)
Admission: EM | Admit: 2024-02-20 | Discharge: 2024-02-20 | Disposition: A | Discharge status: Home / Self Care | Attending: Emergency Medicine | Admitting: Emergency Medicine

## 2024-02-20 DIAGNOSIS — N644 Mastodynia: Secondary | ICD-10-CM

## 2024-02-20 DIAGNOSIS — R079 Chest pain, unspecified: Secondary | ICD-10-CM

## 2024-02-20 DIAGNOSIS — Z7982 Long term (current) use of aspirin: Secondary | ICD-10-CM | POA: Insufficient documentation

## 2024-02-20 DIAGNOSIS — B029 Zoster without complications: Secondary | ICD-10-CM

## 2024-02-20 LAB — TROPONIN I (HIGH SENSITIVITY)
Troponin I (High Sensitivity): 5 ng/L (ref <18)
Troponin I (High Sensitivity): 6 ng/L (ref <18)

## 2024-02-20 LAB — CBC
HCT: 40.5 % (ref 36.0–46.0)
Hemoglobin: 13.5 g/dL (ref 12.0–15.0)
MCH: 30.6 pg (ref 26.0–34.0)
MCHC: 33.3 g/dL (ref 30.0–36.0)
MCV: 91.8 fL (ref 80.0–100.0)
Platelets: 248 K/uL (ref 150–400)
RBC: 4.41 MIL/uL (ref 3.87–5.11)
RDW: 13.1 % (ref 11.5–15.5)
WBC: 6.4 K/uL (ref 4.0–10.5)
nRBC: 0 % (ref 0.0–0.2)

## 2024-02-20 LAB — BASIC METABOLIC PANEL WITH GFR
Anion gap: 8 (ref 5–15)
BUN: 15 mg/dL (ref 8–23)
CO2: 24 mmol/L (ref 22–32)
Calcium: 9 mg/dL (ref 8.9–10.3)
Chloride: 106 mmol/L (ref 98–111)
Creatinine, Ser: 0.75 mg/dL (ref 0.44–1.00)
GFR, Estimated: >60 mL/min (ref >60)
Glucose, Bld: 106 mg/dL — ABNORMAL HIGH (ref 70–99)
Potassium: 3.9 mmol/L (ref 3.5–5.1)
Sodium: 138 mmol/L (ref 135–145)

## 2024-02-20 MED ORDER — ACETAMINOPHEN 500 MG PO TABS
1000.0000 mg | ORAL_TABLET | Freq: Once | ORAL | Status: AC
  Administered 2024-02-20: 1000 mg via ORAL
  Filled 2024-02-20: qty 2

## 2024-02-20 MED ORDER — ACYCLOVIR 200 MG PO CAPS
800.0000 mg | ORAL_CAPSULE | Freq: Once | ORAL | Status: AC
  Administered 2024-02-20: 800 mg via ORAL
  Filled 2024-02-20: qty 4

## 2024-02-20 MED ORDER — ACYCLOVIR 400 MG PO TABS: 800.0000 mg | ORAL_TABLET | Freq: Four times a day (QID) | ORAL | 0 refills | Status: AC

## 2024-02-20 NOTE — ED Notes (Signed)
 Patient is being discharged from the Urgent Care and sent to the Emergency Department via [facility] . Per Rumaldo Ryder, NP, patient is in need of higher level of care due to concern for chest vs breast pain. Patient is aware and verbalizes understanding of plan of care.  Vitals:   02/20/24 1059 02/20/24 1150  BP: (!) 166/99 (!) 141/93  Pulse: (!) 106 98  Resp: (!) 22 18  Temp: (!) 97.3 F (36.3 C)   SpO2: 95% 95%

## 2024-02-20 NOTE — Discharge Instructions (Addendum)
 Your EKG and exam are overall reassuring today.  However this is not the best indicator of damage to your heart.  We are calling [facility] to transport you to the emergency room due to your worsening chest pain.  I have ordered an outpatient mammogram to further evaluate your breast pain. The breast center should be reaching out to you to schedule an appointment for this. If you do not hear from them within the next week you can call to schedule an appointment with them.

## 2024-02-20 NOTE — ED Triage Notes (Signed)
 For 5 days has had pain in left breast.  Described as shooting pain.  Denies any discharge from breast.  Has taken tylenol .  Patient is determined it is the breast, not chest

## 2024-02-20 NOTE — ED Notes (Signed)
Notified [facility] 

## 2024-02-20 NOTE — ED Provider Notes (Signed)
  Physical Exam  BP (!) 160/86   Pulse 85   Temp (!) 97.5 F (36.4 C) (Oral)   Resp 15   Ht 5' 5 (1.651 m)   Wt 67.1 kg   SpO2 97%   BMI 24.63 kg/m   Physical Exam Vitals and nursing note reviewed.  HENT:     Head: Normocephalic and atraumatic.  Eyes:     Pupils: Pupils are equal, round, and reactive to light.  Cardiovascular:     Rate and Rhythm: Normal rate and regular rhythm.  Pulmonary:     Effort: Pulmonary effort is normal.     Breath sounds: Normal breath sounds.  Abdominal:     Palpations: Abdomen is soft.     Tenderness: There is no abdominal tenderness.  Skin:    General: Skin is warm and dry.     Comments: Patchy erythematous lesions with vesicles in dermatomal distribution from left thoracic region extending laterally anteriorly to left breast Not crossing midline  Neurological:     Mental Status: She is alert.  Psychiatric:        Mood and Affect: Mood normal.     Procedures  Procedures  ED Course / MDM   Clinical Course as of 02/20/24 1822  Thu Feb 20, 2024  1821 [place] flat.  Reexamined patient.  Skin lesions most consistent with acute herpes zoster outbreak over the left back left breast not crossing midline.  Will give first dose of acyclovir and prescribe acyclovir here.  Counseled her on symptomatic management.  She will follow-up with PCP. [MP]    Clinical Course User Index [MP] Pamella Ozell LABOR, DO   Medical Decision Making I, Ozell Pamella DO, have assumed care of this patient from the previous provider pending [place] reevaluation and disposition  Amount and/or Complexity of Data Reviewed Labs: ordered. Radiology: ordered.  Risk OTC drugs. Prescription drug management.          Pamella Ozell LABOR, DO 02/20/24 TRENNA

## 2024-02-20 NOTE — ED Notes (Signed)
 X-ray at bedside.

## 2024-02-20 NOTE — Discharge Instructions (Addendum)
 Based on physical examination this is most likely being caused by a herpes zoster (shingles) outbreak We have called in a prescription for an antiviral medication called acyclovir to your pharmacy Pick up this medication and begin taking as directed for the next 7 days please follow-up with your PCP.    For pain, you can take 1000 mg of Tylenol  or 1 g of Tylenol  every 6-8 hours.  Do not exceed more than 4000 mg or 4 g in a 24-hour period.  You can also take ibuprofen 600 to 800 mg every 6-8 hours as well.  Do not take this high-dose ibuprofen for greater than a week.  I do not think this is currently related to your heart.

## 2024-02-20 NOTE — ED Notes (Signed)
NT assisted pt to the bathroom.

## 2024-02-20 NOTE — ED Notes (Signed)
 At bedside for provider exam of patient's complaint

## 2024-02-20 NOTE — ED Provider Notes (Addendum)
 [facility]    CSN: [id number] Arrival date & time: 02/20/24  1037      History   Chief Complaint No chief complaint on file.   HPI Jeanette Butler is a 89 y.o. female.   Patient presents with left breast pain that began about 4 or 5 days ago.  Patient describes the pain as shooting.  Patient denies any discharge from the breast or nipple.  Patient also denies any erythema or skin changes noted to the breast.  Patient states that she has been taking Tylenol  with minimal relief.  Patient states that the pain is not in her chest and only in her breast and that her breast is tender.  Patient denies shortness of breath, weakness, or dizziness.  The history is provided by the patient and medical records.    Past Medical History:  Diagnosis Date   Allergic rhinitis    Anxiety    Atrophic vaginitis    Depression    GERD (gastroesophageal reflux disease)    Hypothyroidism    Migraines    Recurrent UTI     Patient Active Problem List   Diagnosis Date Noted   Nausea & vomiting 03/05/2021   Intractable headache 03/05/2021   COVID-19 virus infection    Urinary frequency 10/04/2017   Insomnia 10/04/2017   Major depression in remission 09/09/2017   Anxiety 09/09/2017   Hypothyroidism 09/09/2017   GERD (gastroesophageal reflux disease) 09/09/2017   Atrophic vaginitis 09/09/2017   Migraines 09/09/2017   Allergic rhinitis 09/09/2017   HNP (herniated nucleus pulposus), lumbar 08/27/2016   Herniated nucleus pulposus 08/26/2016    Past Surgical History:  Procedure Laterality Date   ABDOMINAL HYSTERECTOMY     BACK SURGERY     2006: L4,L5   LUMBAR LAMINECTOMY/DECOMPRESSION MICRODISCECTOMY Left 08/27/2016   Procedure: LUMBAR LAMINECTOMY/DECOMPRESSION MICRODISCECTOMY LUMBAR FIVE-SACRAL ONE LEFT;  Surgeon: Lamar Peaches, MD;  Location: [facility] OR;  Service: Neurosurgery;  Laterality: Left;    OB History   No obstetric history on file.      Home Medications    Prior to  Admission medications   Medication Sig Start Date End Date Taking? Authorizing Provider  butalbital -aspirin -caffeine  (FIORINAL ) 50-325-40 MG capsule TAKE 1 CAPSULE BY MOUTH EVERY 4 HOURS AS NEEDED FOR HEADACHE Patient taking differently: Take 1 capsule by mouth every 4 (four) hours as needed for headache or migraine. 02/06/19   Kennyth Worth HERO, MD  FLUoxetine  (PROZAC ) 20 MG capsule TAKE 1 CAPSULE(20 MG) BY MOUTH DAILY 02/20/22   Kennyth Worth HERO, MD  levothyroxine  (SYNTHROID ) 75 MCG tablet Take 1 tablet (75 mcg total) by mouth daily. 07/17/19   Kennyth Worth HERO, MD  ondansetron  (ZOFRAN ) 4 MG tablet Take 1 tablet (4 mg total) by mouth every 6 (six) hours. 05/03/23   Horton, Roxie M, DO  zolpidem  (AMBIEN ) 10 MG tablet TAKE 1 TABLET BY MOUTH EVERY DAY AT BEDTIME Patient taking differently: Take 10 mg by mouth at bedtime. 05/03/20   Kennyth Worth HERO, MD    Family History Family History  Problem Relation Age of Onset   Skin cancer Neg Hx     Social History Social History   Tobacco Use   Smoking status: Former    Passive exposure: Never   Smokeless tobacco: Former  Building services engineer status: Never Used  Substance Use Topics   Alcohol use: Yes    Alcohol/week: 7.0 standard drinks of alcohol    Types: 7 Glasses of wine per week   Drug  use: No     Allergies   Codeine   Review of Systems Review of Systems  Per HPI  Physical Exam Triage Vital Signs ED Triage Vitals  Encounter Vitals Group     BP 02/20/24 1059 (!) 166/99     Girls Systolic BP Percentile --      Girls Diastolic BP Percentile --      Boys Systolic BP Percentile --      Boys Diastolic BP Percentile --      Pulse Rate 02/20/24 1059 (!) 106     Resp 02/20/24 1059 (!) 22     Temp 02/20/24 1059 (!) 97.3 F (36.3 C)     Temp Source 02/20/24 1059 Oral     SpO2 02/20/24 1059 95 %     Weight --      Height --      Head Circumference --      Peak Flow --      Pain Score 02/20/24 1056 9     Pain Loc --      Pain  Education --      Exclude from Growth Chart --    No data found.  Updated Vital Signs BP (!) 141/93 (BP Location: Left Arm)   Pulse 98   Temp (!) 97.3 F (36.3 C) (Oral)   Resp 18   SpO2 95%   Visual Acuity Right Eye Distance:   Left Eye Distance:   Bilateral Distance:    Right Eye Near:   Left Eye Near:    Bilateral Near:     Physical Exam Vitals and nursing note reviewed.  Constitutional:      General: She is awake. She is not in acute distress.    Appearance: Normal appearance. She is well-developed and well-groomed. She is not ill-appearing.  Cardiovascular:     Rate and Rhythm: Normal rate and regular rhythm.  Pulmonary:     Effort: Pulmonary effort is normal.     Breath sounds: Normal breath sounds.  Chest:     Chest wall: No deformity, swelling, tenderness or edema.  Breasts:    Right: Normal.     Left: Tenderness present. No swelling, bleeding, inverted nipple, mass, nipple discharge or skin change.       Comments: Tenderness noted to left breast. Lymphadenopathy:     Upper Body:     Left upper body: No supraclavicular, axillary or pectoral adenopathy.  Skin:    General: Skin is warm and dry.  Neurological:     General: No focal deficit present.     Mental Status: She is alert and oriented to person, place, and time. Mental status is at baseline.  Psychiatric:        Behavior: Behavior is cooperative.      UC Treatments / Results  Labs (all labs ordered are listed, but only abnormal results are displayed) Labs Reviewed - No data to display  EKG   Radiology No results found.  Procedures Procedures (including critical care time)  Medications Ordered in UC Medications - No data to display  Initial Impression / Assessment and Plan / UC Course  I have reviewed the triage vital signs and the nursing notes.  Pertinent labs & imaging results that were available during my care of the patient were reviewed by me and considered in my medical  decision making (see chart for details).     Patient is overall well-appearing.  Vitals are stable.  Tenderness noted to left breast.  EKG reveals  normal sinus rhythm without acute cardiac findings.  Ordered outpatient mammogram to evaluate breast pain.  Upon discharging patient reported that she was concerned that her breast pain may be related to her heart.  Discussed with patient that if she would like to ensure that her pain is not related to her heart he would need to be evaluated in the emergency department.  Patient reported that her pain was getting worse and seems to be spreading to her chest upon discharge.  Patient states that her transport is not close by and therefore she does not have transportation to the emergency department to be evaluated.  [facility] called due to worsening chest pain to take patient to the emergency department for further evaluation.  Did not discontinue order for mammogram as I still believe this is necessary to evaluate her breast pain. Final Clinical Impressions(s) / UC Diagnoses   Final diagnoses:  Breast pain, left  Chest pain, unspecified type     Discharge Instructions      Your EKG and exam are overall reassuring today.  However this is not the best indicator of damage to your heart.  We are calling [facility] to transport you to the emergency room due to your worsening chest pain.  I have ordered an outpatient mammogram to further evaluate your breast pain. The breast center should be reaching out to you to schedule an appointment for this. If you do not hear from them within the next week you can call to schedule an appointment with them.     ED Prescriptions   None    PDMP not reviewed this encounter.   Johnie Rumaldo LABOR, NP 02/20/24 1209    Johnie Rumaldo A, NP 02/20/24 1220

## 2024-02-20 NOTE — ED Notes (Signed)
 [facility] called.

## 2024-02-20 NOTE — Telephone Encounter (Signed)
 FYI Only or Action Required?: FYI only for provider.  Called Nurse Triage reporting Breast Pain.  Symptoms began several days ago.  Triage Disposition: See Physician Within 4 Hours (or PCP Triage) (overriding See PCP Within 2 Weeks)  Patient/caregiver understands and will follow disposition?: Yes              Copied from CRM [id number]. Topic: Clinical - Red Word Triage >> Feb 20, 2024  8:32 AM Jeanette Butler wrote: Red Word that prompted transfer to Nurse Triage: Left breast issues, she has a lot of shooting pain, tender touch. This has been going on about 4 days and each day it gets worse. Reason for Disposition  [1] Breast pain AND [2] cause is not known  Answer Assessment - Initial Assessment Questions This RN recommends pt goes to urgent care today based off pain level. Pt states she will have someone take her.   Left breast pain- all over Onset: 4-5 days ago  Radiates to left shoulder 9/10 pain level, constant Tender to touch Denies lump, nipple discharge, rash, warmth, chest pain, fever  PRIOR HISTORY: Do you have any history of prior problems with your breasts? (e.g., breast cancer, breast implant, fibrocystic breast disease)     Pt states she has had both breasts operated on for benign lumps  Protocols used: Breast Symptoms-A-AH

## 2024-02-20 NOTE — ED Provider Notes (Signed)
 [place] EMERGENCY DEPARTMENT AT [facility] Provider Note   CSN: [id number] Arrival date & time: 02/20/24  1252     Patient presents with: Breast Pain   Jeanette Butler is a 89 y.o. female.   HPI     Patient presents because of left chest pain. Hurts worse with palpation. Has been taking tylenol  for the pain. Last took tylenol  last night. No exertional chest pain. No SOB. No orthopnea. No pleuritic chest pain. No history of DVT/PE. Hurts worse with palpation of the left breast. No discharge from the breast.  Denies cardiac history.  Previous medical history reviewed : Patient went to urgent care today.   Prior to Admission medications   Medication Sig Start Date End Date Taking? Authorizing Provider  butalbital -aspirin -caffeine  (FIORINAL ) 50-325-40 MG capsule TAKE 1 CAPSULE BY MOUTH EVERY 4 HOURS AS NEEDED FOR HEADACHE Patient taking differently: Take 1 capsule by mouth every 4 (four) hours as needed for headache or migraine. 02/06/19   Kennyth Worth HERO, MD  FLUoxetine  (PROZAC ) 20 MG capsule TAKE 1 CAPSULE(20 MG) BY MOUTH DAILY 02/20/22   Kennyth Worth HERO, MD  levothyroxine  (SYNTHROID ) 75 MCG tablet Take 1 tablet (75 mcg total) by mouth daily. 07/17/19   Kennyth Worth HERO, MD  ondansetron  (ZOFRAN ) 4 MG tablet Take 1 tablet (4 mg total) by mouth every 6 (six) hours. 05/03/23   Horton, Roxie M, DO  zolpidem  (AMBIEN ) 10 MG tablet TAKE 1 TABLET BY MOUTH EVERY DAY AT BEDTIME Patient taking differently: Take 10 mg by mouth at bedtime. 05/03/20   Kennyth Worth HERO, MD    Allergies: Codeine    Review of Systems  Constitutional:  Negative for chills and fever.  HENT:  Negative for ear pain and sore throat.   Eyes:  Negative for pain and visual disturbance.  Respiratory:  Negative for cough and shortness of breath.   Cardiovascular:  Negative for chest pain and palpitations.  Gastrointestinal:  Negative for abdominal pain and vomiting.  Genitourinary:  Negative for dysuria and  hematuria.  Musculoskeletal:  Negative for arthralgias and back pain.  Skin:  Negative for color change and rash.  Neurological:  Negative for seizures and syncope.  All other systems reviewed and are negative.   Updated Vital Signs BP (!) 160/86 (BP Location: Right Arm)   Pulse 87   Temp (!) 97.5 F (36.4 C) (Oral)   Resp 16   Ht 5' 5 (1.651 m)   Wt 67.1 kg   SpO2 98%   BMI 24.63 kg/m   Physical Exam Vitals and nursing note reviewed.  Constitutional:      General: She is not in acute distress.    Appearance: She is well-developed.  HENT:     Head: Normocephalic and atraumatic.  Eyes:     Conjunctiva/sclera: Conjunctivae normal.  Cardiovascular:     Rate and Rhythm: Normal rate and regular rhythm.     Heart sounds: No murmur heard. Pulmonary:     Effort: Pulmonary effort is normal. No respiratory distress.     Breath sounds: Normal breath sounds.  Abdominal:     Palpations: Abdomen is soft.     Tenderness: There is no abdominal tenderness.  Musculoskeletal:        General: No swelling.       Arms:     Cervical back: Neck supple.  Skin:    General: Skin is warm and dry.     Capillary Refill: Capillary refill takes less than 2 seconds.  Neurological:     Mental Status: She is alert.  Psychiatric:        Mood and Affect: Mood normal.     (all labs ordered are listed, but only abnormal results are displayed) Labs Reviewed  BASIC METABOLIC PANEL WITH GFR - Abnormal; Notable for the following components:      Result Value   Glucose, Bld 106 (*)    All other components within normal limits  CBC  TROPONIN I (HIGH SENSITIVITY)  TROPONIN I (HIGH SENSITIVITY)    EKG: EKG Interpretation Date/Time:  Thursday February 20 2024 13:49:02 EDT Ventricular Rate:  83 PR Interval:  180 QRS Duration:  93 QT Interval:  410 QTC Calculation: 482 R Axis:   45  Text Interpretation: Sinus rhythm Confirmed by Simon Rea [phone]) on 02/20/2024 3:00:53 PM  Radiology: [facility]  Chest Port 1 View Result Date: 02/20/2024 CLINICAL DATA:  Chest pain. EXAM: PORTABLE CHEST 1 VIEW COMPARISON:  05/02/2023. FINDINGS: The heart size and mediastinal contours are within normal limits. No focal consolidation, pleural effusion, or pneumothorax. No acute osseous abnormality. IMPRESSION: No acute cardiopulmonary findings. Electronically Signed   By: Harrietta Sherry M.D.   On: 02/20/2024 14:23     Procedures   Medications Ordered in the ED  acetaminophen  (TYLENOL ) tablet 1,000 mg (1,000 mg Oral Given 02/20/24 1349)                                    Medical Decision Making Amount and/or Complexity of Data Reviewed Labs: ordered. Radiology: ordered.  Risk OTC drugs.   Patient presents because of left chest pain. Hurts worse with palpation. Has been taking tylenol  for the pain. Last took tylenol  last night. No exertional chest pain. No SOB. No orthopnea. No pleuritic chest pain. No history of DVT/PE. Hurts worse with palpation of the left breast. No discharge from the breast.   Previous medical history reviewed : Patient went to urgent care today.  Referred for mammography.  Upon exam, patient no acute distress.  Hemodynamically stable.  ANO x 3 GCS 15.  EKG shows no STEMI.  Troponin initially was unremarkable.  Will repeat troponin.  I think this is ACS related.  Likely muscle skeletal versus breast tissue pain.  Patient has outpatient mammography scheduled.  Recommended follow-up with PCP.  Patient signed out pending repeat troponin.  No concerns for DVT or PE.  No risk factors.  No tachypnea or tachycardia.          Final diagnoses:  Breast pain    ED Discharge Orders     None          Simon Rea SAILOR, MD 02/20/24 1546

## 2024-02-20 NOTE — ED Triage Notes (Signed)
 BIB [facility] from UC with sharp pains in left breast worse with inspiration and it is tender to touch.

## 2024-02-25 ENCOUNTER — Other Ambulatory Visit (HOSPITAL_COMMUNITY): Payer: Self-pay | Admitting: Emergency Medicine

## 2024-02-25 DIAGNOSIS — N644 Mastodynia: Secondary | ICD-10-CM

## 2024-05-10 ENCOUNTER — Other Ambulatory Visit: Payer: Self-pay

## 2024-05-10 ENCOUNTER — Emergency Department (HOSPITAL_COMMUNITY)
Admission: EM | Admit: 2024-05-10 | Discharge: 2024-05-10 | Disposition: A | Discharge status: Home / Self Care | Attending: Emergency Medicine | Admitting: Emergency Medicine

## 2024-05-10 ENCOUNTER — Encounter (HOSPITAL_COMMUNITY): Payer: Self-pay

## 2024-05-10 ENCOUNTER — Emergency Department (HOSPITAL_COMMUNITY)

## 2024-05-10 DIAGNOSIS — J101 Influenza due to other identified influenza virus with other respiratory manifestations: Secondary | ICD-10-CM | POA: Insufficient documentation

## 2024-05-10 DIAGNOSIS — R059 Cough, unspecified: Secondary | ICD-10-CM | POA: Diagnosis not present

## 2024-05-10 DIAGNOSIS — R0602 Shortness of breath: Secondary | ICD-10-CM | POA: Diagnosis present

## 2024-05-10 DIAGNOSIS — E039 Hypothyroidism, unspecified: Secondary | ICD-10-CM | POA: Insufficient documentation

## 2024-05-10 DIAGNOSIS — R0981 Nasal congestion: Secondary | ICD-10-CM | POA: Diagnosis present

## 2024-05-10 LAB — CBC WITH DIFFERENTIAL/PLATELET
Abs Immature Granulocytes: 0.03 K/uL (ref 0.00–0.07)
Basophils Absolute: 0 K/uL (ref 0.0–0.1)
Basophils Relative: 1 %
Eosinophils Absolute: 0 K/uL (ref 0.0–0.5)
Eosinophils Relative: 0 %
HCT: 37.5 % (ref 36.0–46.0)
Hemoglobin: 12.8 g/dL (ref 12.0–15.0)
Immature Granulocytes: 1 %
Lymphocytes Relative: 7 %
Lymphs Abs: 0.3 K/uL — ABNORMAL LOW (ref 0.7–4.0)
MCH: 31.8 pg (ref 26.0–34.0)
MCHC: 34.1 g/dL (ref 30.0–36.0)
MCV: 93.1 fL (ref 80.0–100.0)
Monocytes Absolute: 0.6 K/uL (ref 0.1–1.0)
Monocytes Relative: 13 %
Neutro Abs: 3.8 K/uL (ref 1.7–7.7)
Neutrophils Relative %: 78 %
Platelets: 178 K/uL (ref 150–400)
RBC: 4.03 MIL/uL (ref 3.87–5.11)
RDW: 13 % (ref 11.5–15.5)
WBC: 4.9 K/uL (ref 4.0–10.5)
nRBC: 0 % (ref 0.0–0.2)

## 2024-05-10 LAB — LIPASE, BLOOD: Lipase: 21 U/L (ref 11–51)

## 2024-05-10 LAB — COMPREHENSIVE METABOLIC PANEL WITH GFR
ALT: 11 U/L (ref 0–44)
AST: 26 U/L (ref 15–41)
Albumin: 4.3 g/dL (ref 3.5–5.0)
Alkaline Phosphatase: 123 U/L (ref 38–126)
Anion gap: 13 (ref 5–15)
BUN: 7 mg/dL — ABNORMAL LOW (ref 8–23)
CO2: 22 mmol/L (ref 22–32)
Calcium: 9.3 mg/dL (ref 8.9–10.3)
Chloride: 100 mmol/L (ref 98–111)
Creatinine, Ser: 0.72 mg/dL (ref 0.44–1.00)
GFR, Estimated: >60 mL/min
Glucose, Bld: 171 mg/dL — ABNORMAL HIGH (ref 70–99)
Potassium: 4 mmol/L (ref 3.5–5.1)
Sodium: 136 mmol/L (ref 135–145)
Total Bilirubin: 0.4 mg/dL (ref 0.0–1.2)
Total Protein: 7.3 g/dL (ref 6.5–8.1)

## 2024-05-10 LAB — RESP PANEL BY RT-PCR (RSV, FLU A&B, COVID)  RVPGX2
Influenza A by PCR: POSITIVE — AB
Influenza B by PCR: NEGATIVE
Resp Syncytial Virus by PCR: NEGATIVE
SARS Coronavirus 2 by RT PCR: NEGATIVE

## 2024-05-10 LAB — GROUP A STREP BY PCR: Group A Strep by PCR: NOT DETECTED

## 2024-05-10 LAB — PRO BRAIN NATRIURETIC PEPTIDE: Pro Brain Natriuretic Peptide: 581 pg/mL — ABNORMAL HIGH

## 2024-05-10 MED ORDER — ONDANSETRON 4 MG PO TBDP: 4.0000 mg | ORAL_TABLET | Freq: Three times a day (TID) | ORAL | 0 refills | Status: DC | PRN

## 2024-05-10 MED ORDER — OSELTAMIVIR PHOSPHATE 75 MG PO CAPS: 75.0000 mg | ORAL_CAPSULE | Freq: Two times a day (BID) | ORAL | 0 refills | Status: AC

## 2024-05-10 MED ORDER — SODIUM CHLORIDE 0.9 % IV BOLUS
500.0000 mL | Freq: Once | INTRAVENOUS | Status: AC
  Administered 2024-05-10: 500 mL via INTRAVENOUS

## 2024-05-10 MED ORDER — OSELTAMIVIR PHOSPHATE 75 MG PO CAPS: 75.0000 mg | ORAL_CAPSULE | Freq: Two times a day (BID) | ORAL | 0 refills | Status: DC

## 2024-05-10 MED ORDER — ONDANSETRON 4 MG PO TBDP: 4.0000 mg | ORAL_TABLET | Freq: Three times a day (TID) | ORAL | 0 refills | Status: AC | PRN

## 2024-05-10 NOTE — Discharge Instructions (Signed)
Return with any new or worsening symptoms.

## 2024-05-10 NOTE — ED Triage Notes (Signed)
 Pt BIBA from home, A&O x4 c/o N&V, headache and flu like symptoms for last 24 hours. Pt sounds very congested. Generalized body aches. Took Acetaminophen  500 mg 1700 yesterday.   EMS VITALS  150/86 P 110 R  16 95 RTA 149 CBG

## 2024-05-10 NOTE — ED Provider Notes (Signed)
 "  Emergency Department Provider Note   I have reviewed the triage vital signs and the nursing notes.   HISTORY  Chief Complaint Fever, Generalized Body Aches, and Nausea   HPI Jeanette Butler is a 89 y.o. female with past history of below who presents emergency department with cough, congestion, nausea, shortness of breath with bodyaches for the past 24 hours.  She took Tylenol  yesterday with some mild relief in symptoms.  Family at bedside noted that she has been more short of breath and advised presenting to the ED for further evaluation.  Patient denies any chest or abdominal pain.  Appetite has been fair although nothing to eat today.    Past Medical History:  Diagnosis Date   Allergic rhinitis    Anxiety    Atrophic vaginitis    Depression    GERD (gastroesophageal reflux disease)    Hypothyroidism    Migraines    Recurrent UTI     Review of Systems  Constitutional: Positive fever/chills and body aches.  ENT: Positive congestion.  Cardiovascular: Denies chest pain. Respiratory: Positive shortness of breath. Positive cough.  Gastrointestinal: No abdominal pain. Positive nausea.  Genitourinary: Negative for dysuria. Musculoskeletal: Negative for back pain. Skin: Negative for rash. Neurological: Negative for headaches. ____________________________________________   PHYSICAL EXAM:  VITAL SIGNS: ED Triage Vitals  Encounter Vitals Group     BP 05/10/24 0922 (!) 144/94     Pulse Rate 05/10/24 0922 (!) 112     Resp 05/10/24 0922 16     Temp 05/10/24 0922 98.1 F (36.7 C)     Temp Source 05/10/24 0922 Oral     SpO2 05/10/24 0922 95 %     Weight 05/10/24 0925 147 lb 14.9 oz (67.1 kg)     Height 05/10/24 0925 5' 5 (1.651 m)    Constitutional: Alert and oriented. Well appearing and in no acute distress. Eyes: Conjunctivae are normal.  Head: Atraumatic. Nose: No congestion/rhinnorhea. Mouth/Throat: Mucous membranes are moist. Neck: No stridor.  Cardiovascular:  Tachycardia. Good peripheral circulation. Grossly normal heart sounds.   Respiratory: Normal respiratory effort.  No retractions. Lungs CTAB. Gastrointestinal: Soft and nontender. No distention.  Musculoskeletal: No gross deformities of extremities. Neurologic:  Normal speech and language.  Skin:  Skin is warm, dry and intact. No rash noted.  ____________________________________________   LABS (all labs ordered are listed, but only abnormal results are displayed)  Labs Reviewed  RESP PANEL BY RT-PCR (RSV, FLU A&B, COVID)  RVPGX2 - Abnormal; Notable for the following components:      Result Value   Influenza A by PCR POSITIVE (*)    All other components within normal limits  COMPREHENSIVE METABOLIC PANEL WITH GFR - Abnormal; Notable for the following components:   Glucose, Bld 171 (*)    BUN 7 (*)    All other components within normal limits  CBC WITH DIFFERENTIAL/PLATELET - Abnormal; Notable for the following components:   Lymphs Abs 0.3 (*)    All other components within normal limits  PRO BRAIN NATRIURETIC PEPTIDE - Abnormal; Notable for the following components:   Pro Brain Natriuretic Peptide 581.0 (*)    All other components within normal limits  GROUP A STREP BY PCR  LIPASE, BLOOD   ____________________________________________  RADIOLOGY  DG Chest 2 View Result Date: 05/10/2024 CLINICAL DATA:  Shortness of breath. EXAM: CHEST - 2 VIEW COMPARISON:  Chest radiograph dated 02/20/2024. FINDINGS: Mild cardiomegaly with mild vascular congestion. No focal consolidation, pleural effusion, or pneumothorax. No  acute osseous pathology. IMPRESSION: Mild cardiomegaly with mild vascular congestion. Electronically Signed   By: Vanetta Chou M.D.   On: 05/10/2024 12:43    ____________________________________________   PROCEDURES  Procedure(s) performed:   Procedures  None  ____________________________________________   INITIAL IMPRESSION / ASSESSMENT AND PLAN / ED  COURSE  Pertinent labs & imaging results that were available during my care of the patient were reviewed by me and considered in my medical decision making (see chart for details).   This patient is Presenting for Evaluation of flu symptoms, which does require a range of treatment options, and is a complaint that involves a high risk of morbidity and mortality.  The Differential Diagnoses include Flu, COVID, CAP, pulmonary edema, PE, etc.  Critical Interventions-    Medications  sodium chloride  0.9 % bolus 500 mL (0 mLs Intravenous Stopped 05/10/24 1453)    Reassessment after intervention: HR improved.    I did obtain Additional Historical Information from family at bedside.    Clinical Laboratory Tests Ordered, included strep negative.  Flu a positive.  proBNP mildly elevated.  CBC without leukocytosis.  CMP without AKI.   Radiologic Tests Ordered, included CXR. I independently interpreted the images and agree with radiology interpretation.   Cardiac Monitor Tracing which shows NSR.    Social Determinants of Health Risk patient is a non-smoker.   Medical Decision Making: Summary:  Patient presents with flulike symptoms.  Testing positive for flu here.  She is in no distress and eager to go home.  Given her age and vital signs will obtain similar screening blood work and ambulate on pulse ox.  Reevaluation with update and discussion with patient.  Blood work is reassuring.  Her BNP is slightly elevated but no overt pulmonary edema on chest x-ray.  She is up and ambulatory in the room without hypoxemia.  Discussed Tamiflu  and sent a prescription for Zofran  along with strict ED return precautions.  Patient's presentation is most consistent with acute presentation with potential threat to life or bodily function.   Disposition: discharge  ____________________________________________  FINAL CLINICAL IMPRESSION(S) / ED DIAGNOSES  Final diagnoses:  Influenza A     NEW  OUTPATIENT MEDICATIONS STARTED DURING THIS VISIT:  Discharge Medication List as of 05/10/2024  2:38 PM     START taking these medications   Details  ondansetron  (ZOFRAN -ODT) 4 MG disintegrating tablet Take 1 tablet (4 mg total) by mouth every 8 (eight) hours as needed., Starting Sun 05/10/2024, Normal    oseltamivir  (TAMIFLU ) 75 MG capsule Take 1 capsule (75 mg total) by mouth every 12 (twelve) hours., Starting Sun 05/10/2024, Normal        Note:  This document was prepared using Dragon voice recognition software and may include unintentional dictation errors.  Fonda Law, MD, [facility] Emergency Medicine    Dayven Linsley, Fonda MATSU, MD 05/10/24 1501  "

## 2024-05-10 NOTE — ED Notes (Signed)
 Pt ambulated around the room fine, fluctuated between 93 and 97
